# Patient Record
Sex: Male | Born: 1980 | Race: Black or African American | Hispanic: No | Marital: Single | Smoking: Current every day smoker
Health system: Southern US, Community
[De-identification: ages and names within clinical notes are randomized; demographics above are authoritative.]

---

## 2013-01-29 ENCOUNTER — Emergency Department (HOSPITAL_COMMUNITY)
Admission: EM | Admit: 2013-01-29 | Discharge: 2013-01-29 | Disposition: A | Discharge status: Home / Self Care | Payer: Self-pay | Attending: Emergency Medicine | Admitting: Emergency Medicine

## 2013-01-29 ENCOUNTER — Encounter (HOSPITAL_COMMUNITY): Payer: Self-pay | Admitting: Family Medicine

## 2013-01-29 DIAGNOSIS — R52 Pain, unspecified: Secondary | ICD-10-CM | POA: Insufficient documentation

## 2013-01-29 DIAGNOSIS — R110 Nausea: Secondary | ICD-10-CM | POA: Insufficient documentation

## 2013-01-29 DIAGNOSIS — N390 Urinary tract infection, site not specified: Secondary | ICD-10-CM | POA: Insufficient documentation

## 2013-01-29 DIAGNOSIS — R51 Headache: Secondary | ICD-10-CM | POA: Insufficient documentation

## 2013-01-29 DIAGNOSIS — F17200 Nicotine dependence, unspecified, uncomplicated: Secondary | ICD-10-CM | POA: Insufficient documentation

## 2013-01-29 DIAGNOSIS — J029 Acute pharyngitis, unspecified: Secondary | ICD-10-CM | POA: Insufficient documentation

## 2013-01-29 LAB — COMPREHENSIVE METABOLIC PANEL
ALT: 29 U/L (ref 0–53)
AST: 39 U/L — ABNORMAL HIGH (ref 0–37)
Albumin: 3.6 g/dL (ref 3.5–5.2)
Alkaline Phosphatase: 93 U/L (ref 39–117)
Potassium: 4.5 mEq/L (ref 3.5–5.1)
Sodium: 131 mEq/L — ABNORMAL LOW (ref 135–145)
Total Protein: 8.1 g/dL (ref 6.0–8.3)

## 2013-01-29 LAB — URINALYSIS, ROUTINE W REFLEX MICROSCOPIC
Glucose, UA: NEGATIVE mg/dL
Ketones, ur: 15 mg/dL — AB
Nitrite: POSITIVE — AB
Protein, ur: 30 mg/dL — AB
Specific Gravity, Urine: 1.03 (ref 1.005–1.030)
Urobilinogen, UA: 1 mg/dL (ref 0.0–1.0)
pH: 5.5 (ref 5.0–8.0)

## 2013-01-29 LAB — URINE MICROSCOPIC-ADD ON

## 2013-01-29 LAB — RAPID STREP SCREEN (MED CTR MEBANE ONLY): Streptococcus, Group A Screen (Direct): NEGATIVE

## 2013-01-29 LAB — CBC WITH DIFFERENTIAL/PLATELET
Basophils Absolute: 0 10*3/uL (ref 0.0–0.1)
Basophils Relative: 0 % (ref 0–1)
Eosinophils Absolute: 0 10*3/uL (ref 0.0–0.7)
Lymphs Abs: 1 10*3/uL (ref 0.7–4.0)
MCH: 29.1 pg (ref 26.0–34.0)
Neutrophils Relative %: 77 % (ref 43–77)
Platelets: 183 10*3/uL (ref 150–400)
RBC: 4.36 MIL/uL (ref 4.22–5.81)

## 2013-01-29 MED ORDER — MORPHINE SULFATE 4 MG/ML IJ SOLN
4.0000 mg | Freq: Once | INTRAMUSCULAR | Status: AC
  Administered 2013-01-29: 4 mg via INTRAVENOUS
  Filled 2013-01-29: qty 1

## 2013-01-29 MED ORDER — SODIUM CHLORIDE 0.9 % IV BOLUS (SEPSIS)
2000.0000 mL | Freq: Once | INTRAVENOUS | Status: AC
  Administered 2013-01-29: 2000 mL via INTRAVENOUS

## 2013-01-29 MED ORDER — CIPROFLOXACIN HCL 500 MG PO TABS: 500.0000 mg | ORAL_TABLET | Freq: Two times a day (BID) | ORAL | Status: DC

## 2013-01-29 MED ORDER — METOCLOPRAMIDE HCL 5 MG/ML IJ SOLN
10.0000 mg | Freq: Once | INTRAMUSCULAR | Status: AC
  Administered 2013-01-29: 10 mg via INTRAVENOUS
  Filled 2013-01-29: qty 2

## 2013-01-29 MED ORDER — METRONIDAZOLE 500 MG PO TABS
2000.0000 mg | ORAL_TABLET | Freq: Once | ORAL | Status: AC
  Administered 2013-01-29: 2000 mg via ORAL
  Filled 2013-01-29: qty 4

## 2013-01-29 MED ORDER — IBUPROFEN 800 MG PO TABS: 800.0000 mg | ORAL_TABLET | Freq: Three times a day (TID) | ORAL | Status: DC | PRN

## 2013-01-29 MED ORDER — KETOROLAC TROMETHAMINE 30 MG/ML IJ SOLN
30.0000 mg | Freq: Once | INTRAMUSCULAR | Status: AC
  Administered 2013-01-29: 30 mg via INTRAVENOUS
  Filled 2013-01-29: qty 1

## 2013-01-29 MED ORDER — CIPROFLOXACIN IN D5W 400 MG/200ML IV SOLN
400.0000 mg | Freq: Once | INTRAVENOUS | Status: AC
  Administered 2013-01-29: 400 mg via INTRAVENOUS
  Filled 2013-01-29 (×2): qty 200

## 2013-01-29 MED ORDER — HYDROCODONE-ACETAMINOPHEN 5-325 MG PO TABS: 1.0000 | ORAL_TABLET | Freq: Four times a day (QID) | ORAL | Status: DC | PRN

## 2013-01-29 MED ORDER — DEXTROSE 5 % IV SOLN: 1.0000 g | Freq: Once | INTRAVENOUS | Status: DC

## 2013-01-29 MED ORDER — ACETAMINOPHEN 325 MG PO TABS
650.0000 mg | ORAL_TABLET | Freq: Once | ORAL | Status: AC
  Administered 2013-01-29: 650 mg via ORAL
  Filled 2013-01-29: qty 2

## 2013-01-29 NOTE — ED Provider Notes (Signed)
Medical screening examination/treatment/procedure(s) were performed by non-physician practitioner and as supervising physician I was immediately available for consultation/collaboration.    Vida Roller, MD 01/29/13 [phone]

## 2013-01-29 NOTE — ED Notes (Signed)
Per pt fever, HA, Nausea, diarrhea for 4 days.

## 2013-01-29 NOTE — ED Provider Notes (Signed)
History     CSN: [id number]  Arrival date & time 01/29/13  1206   First MD Initiated Contact with Patient 01/29/13 1254      Chief Complaint  Patient presents with  . Fever  . Nausea  . Headache    (Consider location/radiation/quality/duration/timing/severity/associated sxs/prior treatment) HPI Patient presents to the emergency department with fever, and body aches for the last 24 hour.  Patient, states, that he also has headache.  Patient denies chest pain, shortness of breath, cough, nausea, vomiting, weakness, dizziness, abdominal pain, visual change, neck pain, difficulty swallowing, difficulty breathing, runny nose, ear pain, or syncope.  Patient, states, that has had some sore throat.  The patient states he did not take anything prior to arrival for his symptoms.  Patient, states nothing seems to make his condition, better or worse. History reviewed. No pertinent past medical history.  History reviewed. No pertinent past surgical history.  History reviewed. No pertinent family history.  History  Substance Use Topics  . Smoking status: Current Every Day Smoker  . Smokeless tobacco: Not on file  . Alcohol Use: Yes      Review of Systems All other systems negative except as documented in the HPI. All pertinent positives and negatives as reviewed in the HPI. Allergies  Review of patient's allergies indicates no known allergies.  Home Medications   Current Outpatient Rx  Name  Route  Sig  Dispense  Refill  . Acetaminophen (TYLENOL PO)   Oral   Take 2 tablets by mouth every 6 (six) hours as needed (fever).         . ASPIRIN PO   Oral   Take 2 tablets by mouth 2 (two) times daily as needed (pain.).         Marland Kitchen Chlorphen-Pseudoephed-APAP (THERAFLU FLU/COLD PO)   Oral   Take 1 capsule by mouth once as needed.           BP 122/64  Pulse 73  Temp(Src) 99.6 F (37.6 C) (Oral)  Resp 20  SpO2 96%  Physical Exam  Nursing note and vitals  reviewed. Constitutional: He is oriented to person, place, and time. He appears well-developed and well-nourished. No distress.  HENT:  Head: Normocephalic and atraumatic.  Mouth/Throat: Oropharynx is clear and moist. No oropharyngeal exudate.  Eyes: Pupils are equal, round, and reactive to light.  Neck: Normal range of motion. Neck supple. No tracheal deviation present.  Cardiovascular: Normal rate, regular rhythm and normal heart sounds.  Exam reveals no gallop and no friction rub.   No murmur heard. Pulmonary/Chest: Effort normal and breath sounds normal. No stridor. No respiratory distress. He has no wheezes. He has no rales.  Abdominal: Soft. Bowel sounds are normal. He exhibits no distension. There is no tenderness. There is no guarding.  Lymphadenopathy:    He has no cervical adenopathy.  Neurological: He is alert and oriented to person, place, and time. He exhibits normal muscle tone. Coordination normal.  Skin: Skin is warm and dry. No rash noted.    ED Course  Procedures (including critical care time)  Labs Reviewed  CBC WITH DIFFERENTIAL - Abnormal; Notable for the following:    Hemoglobin 12.7 (*)    HCT 38.0 (*)    All other components within normal limits  COMPREHENSIVE METABOLIC PANEL - Abnormal; Notable for the following:    Sodium 131 (*)    Chloride 95 (*)    Glucose, Bld 126 (*)    AST 39 (*)  GFR calc non Af Amer 78 (*)    All other components within normal limits  URINALYSIS, ROUTINE W REFLEX MICROSCOPIC - Abnormal; Notable for the following:    Color, Urine AMBER (*)    APPearance CLOUDY (*)    Hgb urine dipstick MODERATE (*)    Bilirubin Urine SMALL (*)    Ketones, ur 15 (*)    Protein, ur 30 (*)    Nitrite POSITIVE (*)    Leukocytes, UA LARGE (*)    All other components within normal limits  URINE MICROSCOPIC-ADD ON - Abnormal; Notable for the following:    Squamous Epithelial / LPF FEW (*)    Bacteria, UA MANY (*)    All other components within  normal limits  RAPID STREP SCREEN  URINE CULTURE  CBC WITH DIFFERENTIAL  BASIC METABOLIC PANEL   patient has findings on his urinalysis, that are concerning for UTI.  Patient does not have any abdominal pain, flank pain, or back pain.  He does, however, have body aches, most likely due to the fever.  Patient is given 2 L of fluid is feeling some better, but still has mild headache.  Patient will be advised to return here for any worsening in his condition.  Patient has had no vomiting, and his vital signs have remained stable here in the emergency department.  MDM  MDM Reviewed: vitals and nursing note Interpretation: labs            Carlyle Dolly, PA-C 01/29/13 1626

## 2013-01-31 LAB — URINE CULTURE

## 2013-01-31 LAB — GC/CHLAMYDIA PROBE AMP
CT Probe RNA: NEGATIVE
GC Probe RNA: NEGATIVE

## 2013-02-01 ENCOUNTER — Telehealth (HOSPITAL_COMMUNITY): Payer: Self-pay | Admitting: Emergency Medicine

## 2013-02-01 NOTE — ED Notes (Signed)
+  Urine. Patient treated with Cipro. Sensitive to same. Per protocol MD. °

## 2013-02-01 NOTE — ED Notes (Signed)
Patient has +Urine culture. °

## 2013-12-15 ENCOUNTER — Encounter (HOSPITAL_COMMUNITY): Payer: Self-pay | Admitting: Emergency Medicine

## 2013-12-15 ENCOUNTER — Emergency Department (HOSPITAL_COMMUNITY)
Admission: EM | Admit: 2013-12-15 | Discharge: 2013-12-15 | Discharge status: Against Medical Advice | Payer: Self-pay | Attending: Emergency Medicine | Admitting: Emergency Medicine

## 2013-12-15 DIAGNOSIS — R51 Headache: Secondary | ICD-10-CM | POA: Insufficient documentation

## 2013-12-15 DIAGNOSIS — M542 Cervicalgia: Secondary | ICD-10-CM | POA: Insufficient documentation

## 2013-12-15 DIAGNOSIS — F17200 Nicotine dependence, unspecified, uncomplicated: Secondary | ICD-10-CM | POA: Insufficient documentation

## 2013-12-15 NOTE — ED Notes (Signed)
Called pt name three times to reassess vital signs, no answer.

## 2013-12-15 NOTE — ED Notes (Signed)
Called x's 3 without answer 

## 2013-12-15 NOTE — ED Notes (Signed)
Called patient for reassessment. No answer.  

## 2013-12-15 NOTE — ED Notes (Signed)
Pt reports headache for 5 days that goes to different sides, but can feel ache behind his right eye.  No change in vision.  Pt reports some neck pain that was intermittent for a while before ever having the headache.  No weakness in arms or legs

## 2014-01-02 ENCOUNTER — Emergency Department (HOSPITAL_COMMUNITY): Payer: Self-pay

## 2014-01-02 ENCOUNTER — Encounter (HOSPITAL_COMMUNITY): Payer: Self-pay | Admitting: Emergency Medicine

## 2014-01-02 ENCOUNTER — Emergency Department (HOSPITAL_COMMUNITY)
Admission: EM | Admit: 2014-01-02 | Discharge: 2014-01-02 | Disposition: A | Discharge status: Home / Self Care | Payer: Self-pay | Attending: Emergency Medicine | Admitting: Emergency Medicine

## 2014-01-02 DIAGNOSIS — F17200 Nicotine dependence, unspecified, uncomplicated: Secondary | ICD-10-CM | POA: Insufficient documentation

## 2014-01-02 DIAGNOSIS — N12 Tubulo-interstitial nephritis, not specified as acute or chronic: Secondary | ICD-10-CM | POA: Insufficient documentation

## 2014-01-02 LAB — URINALYSIS, ROUTINE W REFLEX MICROSCOPIC
Bilirubin Urine: NEGATIVE
GLUCOSE, UA: NEGATIVE mg/dL
KETONES UR: NEGATIVE mg/dL
Nitrite: NEGATIVE
PH: 5.5 (ref 5.0–8.0)
Protein, ur: NEGATIVE mg/dL
Specific Gravity, Urine: 1.024 (ref 1.005–1.030)
Urobilinogen, UA: 0.2 mg/dL (ref 0.0–1.0)

## 2014-01-02 LAB — URINE MICROSCOPIC-ADD ON

## 2014-01-02 LAB — BASIC METABOLIC PANEL
BUN: 18 mg/dL (ref 6–23)
CHLORIDE: 99 meq/L (ref 96–112)
CO2: 25 mEq/L (ref 19–32)
Calcium: 9.7 mg/dL (ref 8.4–10.5)
Creatinine, Ser: 1.03 mg/dL (ref 0.50–1.35)
GFR calc Af Amer: >90 mL/min (ref >90)
GFR calc non Af Amer: >90 mL/min (ref >90)
GLUCOSE: 86 mg/dL (ref 70–99)
POTASSIUM: 4.9 meq/L (ref 3.7–5.3)
Sodium: 138 mEq/L (ref 137–147)

## 2014-01-02 LAB — CBC
HEMATOCRIT: 38.8 % — AB (ref 39.0–52.0)
HEMOGLOBIN: 13 g/dL (ref 13.0–17.0)
MCH: 30.4 pg (ref 26.0–34.0)
MCHC: 33.5 g/dL (ref 30.0–36.0)
MCV: 90.7 fL (ref 78.0–100.0)
Platelets: 215 10*3/uL (ref 150–400)
RBC: 4.28 MIL/uL (ref 4.22–5.81)
RDW: 12.4 % (ref 11.5–15.5)
WBC: 12.4 10*3/uL — AB (ref 4.0–10.5)

## 2014-01-02 MED ORDER — CIPROFLOXACIN HCL 500 MG PO TABS: 500.0000 mg | ORAL_TABLET | Freq: Two times a day (BID) | ORAL | Status: DC

## 2014-01-02 MED ORDER — HYDROCODONE-ACETAMINOPHEN 5-325 MG PO TABS: 1.0000 | ORAL_TABLET | ORAL | Status: DC | PRN

## 2014-01-02 MED ORDER — SODIUM CHLORIDE 0.9 % IV BOLUS (SEPSIS)
1000.0000 mL | Freq: Once | INTRAVENOUS | Status: AC
  Administered 2014-01-02: 1000 mL via INTRAVENOUS

## 2014-01-02 NOTE — ED Notes (Signed)
He does not want to be stuck for labs because he thinks he has a UTI and does not like needles, discussed importance of labs for abd pain with pt but he states he would rather wait

## 2014-01-02 NOTE — ED Provider Notes (Signed)
CSN: [id number]     Arrival date & time 01/02/14  1131 History   First MD Initiated Contact with Patient 01/02/14 1210     Chief Complaint  Patient presents with  . Hematuria     (Consider location/radiation/quality/duration/timing/severity/associated sxs/prior Treatment) HPI Comments: Franklin Love is a 33 y.o. Male, presenting the Emergency Department with a chief complaint of Right abdominal pain and hematuria.  The patient reports intermittent sharp pain to RLQ with radiation to groin.  The patient describes the RLQ discomfort as, sharp, radiation to the groin, intermittent, abrupt onset and crippling. Denies pain at this time. The patient reports hematuria since today.  He reports dysuria.  Denies penile discharge, testicular pain, testicular swelling, genital lesions.  He denies nausea or vomiting. He denies a history of kidney stones. Hereports very little water intake and reports heavy EtOH use. No PCP   The history is provided by the patient and medical records. No language interpreter was used.    History reviewed. No pertinent past medical history. History reviewed. No pertinent past surgical history. History reviewed. No pertinent family history. History  Substance Use Topics  . Smoking status: Current Every Day Smoker  . Smokeless tobacco: Not on file  . Alcohol Use: Yes    Review of Systems  Constitutional: Negative for fever and chills.  Gastrointestinal: Positive for abdominal pain. Negative for nausea, vomiting, diarrhea, blood in stool, abdominal distention and anal bleeding.  Genitourinary: Positive for dysuria and hematuria. Negative for urgency, discharge, scrotal swelling, genital sores, penile pain and testicular pain.  Musculoskeletal: Negative for back pain.  Skin: Negative for pallor and wound.      Allergies  Review of patient's allergies indicates no known allergies.  Home Medications   No current outpatient prescriptions on file. BP 113/71   Pulse 64  Temp(Src) 99.1 F (37.3 C) (Oral)  Resp 20  Ht 5\' 7"  (1.702 m)  Wt 197 lb 6.4 oz (89.54 kg)  BMI 30.91 kg/m2  SpO2 98% Physical Exam  Nursing note and vitals reviewed. Constitutional: He is oriented to person, place, and time. He appears well-developed and well-nourished. No distress.  HENT:  Head: Normocephalic and atraumatic.  Eyes: EOM are normal. Pupils are equal, round, and reactive to light. No scleral icterus.  Neck: Neck supple.  Cardiovascular: Normal rate, regular rhythm and normal heart sounds.   No murmur heard. Pulmonary/Chest: Effort normal and breath sounds normal. No respiratory distress. He has no wheezes. He has no rales.  Abdominal: Soft. Normal appearance and bowel sounds are normal. There is no tenderness. There is no rigidity, no rebound, no guarding, no CVA tenderness and no tenderness at McBurney's point.  Unable to reproduce discomfort with palpation.  Musculoskeletal: Normal range of motion. He exhibits no edema.  Neurological: He is alert and oriented to person, place, and time.  Skin: Skin is warm and dry. No rash noted.  Psychiatric: He has a normal mood and affect. His behavior is normal.    ED Course  Procedures (including critical care time) Labs Review Labs Reviewed  URINALYSIS, ROUTINE W REFLEX MICROSCOPIC - Abnormal; Notable for the following:    APPearance CLOUDY (*)    Hgb urine dipstick LARGE (*)    Leukocytes, UA LARGE (*)    All other components within normal limits  CBC - Abnormal; Notable for the following:    WBC 12.4 (*)    HCT 38.8 (*)    All other components within normal limits  URINE MICROSCOPIC-ADD ON -  Abnormal; Notable for the following:    Squamous Epithelial / LPF FEW (*)    Bacteria, UA MANY (*)    All other components within normal limits  BASIC METABOLIC PANEL   Imaging Review Ct Abdomen Pelvis Wo Contrast  01/02/2014   CLINICAL DATA:  Hematuria, right flank pain  EXAM: CT ABDOMEN AND PELVIS WITHOUT  CONTRAST  TECHNIQUE: Multidetector CT imaging of the abdomen and pelvis was performed following the standard protocol without intravenous contrast.  COMPARISON:  None.  FINDINGS: There is no nephrolithiasis or hydroureteronephrosis bilaterally. The kidneys are normal. The liver, spleen, pancreas, gallbladder, adrenal glands are normal. The aorta is normal. There is no abdominal lymphadenopathy. There is no small bowel obstruction or diverticulitis. The appendix is normal.  The bladder is decompressed limiting evaluation. The lung bases are clear. The visualized bones are normal.  IMPRESSION: No nephrolithiasis or hydroureteronephrosis bilaterally. The appendix is normal. No acute abnormality identified in the abdomen and pelvis.   Electronically Signed   By: Sherian Rein M.D.   On: 01/02/2014 14:44     EKG Interpretation None      MDM   Final diagnoses:  Pyelonephritis   Pt with intermittent abdominal pain for 3 days, dysuria, and hematuria.  No history of stone.  EMR shows treated for UTI in the past.  Denies pain in the ED. UA shows RBC, Many bacteria.  Large Leukocytes. CT without evidence of stone. Will treat for UTI and have the patient follow up with urology for further evaluation of 2 urinary infections in a male pt. Re-eval Pt denies abdominal discomfort while in the ED. Discussed lab results, imaging results, and treatment plan with the patient. Advises drinking more fluids and avoiding EtOH.  Resources given. Return precautions given. Reports understanding and no other concerns at this time.  Patient is stable for discharge at this time.  Meds given in ED:  Medications  sodium chloride 0.9 % bolus 1,000 mL (0 mLs Intravenous Stopped 01/02/14 1519)    Discharge Medication List as of 01/02/2014  3:31 PM    START taking these medications   Details  ciprofloxacin (CIPRO) 500 MG tablet Take 1 tablet (500 mg total) by mouth 2 (two) times daily., Starting 01/02/2014, Until  Discontinued, Print    HYDROcodone-acetaminophen (NORCO/VICODIN) 5-325 MG per tablet Take 1 tablet by mouth every 4 (four) hours as needed., Starting 01/02/2014, Until Discontinued, Print          Clabe Seal, PA-C 01/03/14 1431

## 2014-01-02 NOTE — ED Notes (Signed)
Pt states hes had R sided abd pain for past 3 days then today he noticed blood in his urine

## 2014-01-02 NOTE — Discharge Instructions (Signed)
Call Dr Isabel Caprice [phone] for further evaluation of your hematuria and urinary tract infection. Call for a follow up appointment with a Family or Primary Care Provider.  Return if Symptoms worsen.   Stop drinking alcohol. If you need help with your alcohol use, see resource list below.  Or you can return to the ED for help. And drink plenty of water to help with your infection.  Take medication as prescribed.    Emergency Department Resource Guide 1) Find a Doctor and Pay Out of Pocket Although you won't have to find out who is covered by your insurance plan, it is a good idea to ask around and get recommendations. You will then need to call the office and see if the doctor you have chosen will accept you as a new patient and what types of options they offer for patients who are self-pay. Some doctors offer discounts or will set up payment plans for their patients who do not have insurance, but you will need to ask so you aren't surprised when you get to your appointment.  2) Contact Your Local Health Department Not all health departments have doctors that can see patients for sick visits, but many do, so it is worth a call to see if yours does. If you don't know where your local health department is, you can check in your phone book. The CDC also has a tool to help you locate your state's health department, and many state websites also have listings of all of their local health departments.  3) Find a Walk-in Clinic If your illness is not likely to be very severe or complicated, you may want to try a walk in clinic. These are popping up all over the country in pharmacies, drugstores, and shopping centers. They're usually staffed by nurse practitioners or physician assistants that have been trained to treat common illnesses and complaints. They're usually fairly quick and inexpensive. However, if you have serious medical issues or chronic medical problems, these are probably not your best  option.  No Primary Care Doctor: - Call Health Connect at  [phone] - they can help you locate a primary care doctor that  accepts your insurance, provides certain services, etc. - Physician Referral Service- [phone]  Chronic Pain Problems: Organization         Address  Phone   Notes  [facility]  [phone] Patients need to be referred by their primary care doctor.   Medication Assistance: Organization         Address  Phone   Notes  [facility] Medication [facility] [address] [place] [place], [place] [address] [phone] --Must be a resident of [facility] -- Must have NO insurance coverage whatsoever (no Medicaid/ Medicare, etc.) -- The pt. MUST have a primary care doctor that directs their care regularly and follows them in the community   [facility]  [phone]   [employer]  [phone]    Agencies that provide inexpensive medical care: Organization         Address  Phone   Notes  [facility]  [phone]   [facility]    [phone]   [facility] [address] [place], [place] [address] [phone]   [facility] [address] [place], [place] [phone]   Planned Parenthood    [phone]   [facility]    [phone]  [facility]  [address], [place] Phone:  [phone], Fax:  [phone] Hours of Operation:  9 am - 6 pm, M-F.  Also accepts Medicaid/Medicare and self-pay.  [facility] for [place] [address], [address], [place] Phone: [phone], Fax: [phone]. Hours of Operation:  8:30 am - 5:30 pm, M-F.  Also accepts Medicaid and self-pay.  [facility] [place] [address] [place] Phone: [phone]   [place], [place], [place] [phone], Ext. [phone] Mondays & Thursdays: 7-9 AM.  First 15  patients are seen on a first come, first serve basis.    [place] Providers:  Organization         Address  Phone   Notes  [facility] [address],  [phone] Also accepts self-pay patients.  [facility] [address], [place]  [phone]   [place], [address], [place] [phone]   [facility] [address], [place] [phone]   Lucianne Lei [address], [place]   [phone] Only accepts [place] Access [place] patients after they have their name applied to their card.   Self-Pay (no insurance) in [facility]:  Organization         Address  Phone   Notes  Sickle Cell Patients, [facility] Internal Medicine [place] [phone]   [facility] [place] [phone]   [facility] [place]  [place], [place], [place] [phone]   [facility]/Dr. Osei-Bonsu  [address] [place] or [address], [place] [phone] Phone number for both [place] and [place] locations is the same.  [facility]gent Medical and [facility] [address], [place] [phone]   [facility] [address], [place] or [address] [phone]   [facility] [address], [place] [phone], phone; [phone], fax Sees patients 1st and 3rd Saturday of every month.  Must not qualify for public or private insurance (i.e. Medicaid, Medicare, [place] Health Choice, Veterans' Benefits)  Household income should be no more than 200% of the poverty level The clinic cannot treat you if you are pregnant or think you are pregnant  Sexually transmitted diseases are not treated at the clinic.    Dental  Care: Organization         Address  Phone  Notes  [facility] Regino Ramirez [phone] Accepts children up to age 62 who are enrolled in [place] or [place]; pregnant women with a Medicaid card; and children who have applied for Medicaid or [place] Health Choice, but were declined, whose parents can pay a reduced fee at time of service.  [facility] Department of [facility]  [address], [place] [phone] Accepts children up to age 71 who are enrolled in [place] or [place]; pregnant women with a Medicaid card; and children who have applied for Medicaid or [place] Health Choice, but were declined, whose parents can pay a reduced fee at time of service.  [place]  [phone]  [place] [phone] Patients are seen by appointment only. Walk-ins are not accepted. [place] will see patients 26 years of age and older. Monday - Tuesday (8am-5pm) Most Wednesdays (8:30-5pm) $30 per visit, cash only  [facility] Adult Dental Access PROGRAM  [address], [facility] [phone] Patients are seen by appointment only. Walk-ins are not accepted. [place] will see patients 62 years of age and older. One Wednesday Evening (Monthly: Volunteer Based).  $30 per visit, cash only  [place]  [phone] for adults; Children under age 39, call Graduate Pediatric Dentistry at [phone]. Children aged 75-14, please call [phone] to request a pediatric application.  Dental services are provided in all areas of dental care including fillings, crowns and bridges, complete and partial dentures, implants, gum treatment, root canals, and extractions. Preventive care is also provided. Treatment is provided to both adults and children. Patients are selected via a lottery and there is often a waiting list.   [facility] [address] [place]  [phone] [email or web address]   [facility] [address] [place], [place] [phone], Ext. [phone] Second and Fourth Thursday of each month, opens at 6:30 AM; Clinic ends at 9 AM.  Patients are seen on a first-come first-served basis, and a limited number are seen during each clinic.   [facility]  [address] Hillard Danker [place], [place] [phone]   Eligibility Requirements You must have lived in [place], [place], or [place] counties for at least the last three months.   You cannot be eligible for state or federal sponsored [employer], including [employer], [place], or [employer].   You generally cannot be eligible for healthcare insurance through your employer.    How to apply: Eligibility screenings are held every Tuesday and Wednesday afternoon from 1:00 pm until 4:00 pm. You do not need an appointment for the interview!  [facility] [address] [place], [place]   [place]  [place] Department  [place]  [phone]    Behavioral Health Resources in the Community: Intensive Outpatient Programs Organization         Address  Phone  Notes  [place]. [address] [place], [place] [phone]   [facility] Outpatient [address] [place], [place]   [facility]: [facility] [address], [place], [place]   [place] [address]  [place], [place] or [phone]   Substance Abuse Resources Organization         Address  Phone  Notes  Alcohol and Drug Services  [phone]   [place]  [phone]   The [place]   Chinita Pester  [phone]   Residential & Outpatient Substance Abuse Program  [phone]    Psychological Services Organization         Address  Phone  Notes  [facility] [place]  [place]  [phone]   [place] [address] [place] or [phone]    Mobile Crisis Teams Organization         Address  Phone  Notes  [facility], Mobile Crisis Care Unit  [phone]   [facility]  [address] [place], [place]  [facility] De[facility]sch [address] [place] [address] [place] [phone]    Self-Help/Support Groups Organization         Address  Phone             Notes  [facility] [place] - variety of support groups  336- [phone] Call for more information  Narcotics Anonymous (NA), [facility] [address], [place] [employer]  2 meetings at this location   Statisticianesidential Treatment Programs Organization         Address  Phone  Notes  [facility] [address],    [place]  [phone]   [facility]  [address], [place] [phone], [place], [place] [phone]   [facility] [address], [place] [phone] Admissions: 8am-3pm M-F  [facility] [address]    [place], [place] [phone]   [facility] [address], [place], [place] [phone]   [facility] [address]  [place], [place] [phone]   [facility] - Intensive Outpatient [address], [place], [place] [phone]   [facility] ([facility] [address]  [place], [place] [phone] or [phone]   [facility] ([facility]) [address] [place], [place] [phone] Accepts Medicaid  [facility] [address]  [place] [phone] Substance Abuse/Addiction Treatment   [facility] Behavioral Health Resources Organization         Address  Phone  Notes  [facility]  [phone]   Angie FavaJulie Brannon, PhD [address], [address] [place], [place]   [phone] or [phone]   [facility]   [address] [place], [place] [phone](336) [phone]   [facility] [address], [place], [place] [phone] Insurance/Medicaid/sponsorship through [facility]  [facility] Families [address]                                    [place], [place] [phone] Therapy/tele-psych/case  [facility] [address].   [place], [place] [phone]    Dr. Lolly MustacheArfeen  [phone]   [facility] [place]  [employer] [facility] 1) [address], [place] 2) [address], [place] 3)  [address], [place] [phone]  [phone]   [facility] Child Abuse Hotline [phone]([phone]) [phone] or [phone] (After Hours)

## 2014-01-05 NOTE — ED Provider Notes (Signed)
Medical screening examination/treatment/procedure(s) were performed by non-physician practitioner and as supervising physician I was immediately available for consultation/collaboration.   Toy BakerAnthony T Areon Cocuzza, MD 01/05/14 [phone]

## 2014-07-07 ENCOUNTER — Emergency Department (HOSPITAL_COMMUNITY)
Admission: EM | Admit: 2014-07-07 | Discharge: 2014-07-07 | Disposition: A | Discharge status: Home / Self Care | Payer: Self-pay

## 2014-07-07 NOTE — ED Notes (Signed)
Pt called x1, no answer 

## 2015-11-07 ENCOUNTER — Emergency Department (HOSPITAL_COMMUNITY): Payer: Self-pay

## 2015-11-07 ENCOUNTER — Emergency Department (HOSPITAL_COMMUNITY)
Admission: EM | Admit: 2015-11-07 | Discharge: 2015-11-07 | Disposition: A | Discharge status: Home / Self Care | Payer: Self-pay | Attending: Emergency Medicine | Admitting: Emergency Medicine

## 2015-11-07 ENCOUNTER — Encounter (HOSPITAL_COMMUNITY): Payer: Self-pay | Admitting: Emergency Medicine

## 2015-11-07 DIAGNOSIS — B353 Tinea pedis: Secondary | ICD-10-CM | POA: Insufficient documentation

## 2015-11-07 DIAGNOSIS — F17200 Nicotine dependence, unspecified, uncomplicated: Secondary | ICD-10-CM | POA: Insufficient documentation

## 2015-11-07 DIAGNOSIS — Z792 Long term (current) use of antibiotics: Secondary | ICD-10-CM | POA: Insufficient documentation

## 2015-11-07 DIAGNOSIS — M79672 Pain in left foot: Secondary | ICD-10-CM

## 2015-11-07 MED ORDER — CEPHALEXIN 500 MG PO CAPS: 500.0000 mg | ORAL_CAPSULE | Freq: Four times a day (QID) | ORAL | Status: DC

## 2015-11-07 MED ORDER — CLOTRIMAZOLE 1 % EX CREA: TOPICAL_CREAM | CUTANEOUS | Status: DC

## 2015-11-07 MED ORDER — IBUPROFEN 600 MG PO TABS: 600.0000 mg | ORAL_TABLET | Freq: Three times a day (TID) | ORAL | Status: DC | PRN

## 2015-11-07 NOTE — Discharge Instructions (Signed)
Read the information below.  Use the prescribed medication as directed.  Please discuss all new medications with your pharmacist.  You may return to the Emergency Department at any time for worsening condition or any new symptoms that concern you.  If you develop redness, swelling, pus draining from the wound, or fevers greater than 100.4, return to the ER immediately for a recheck.     Athlete's Foot Athlete's foot (tinea pedis) is a fungal infection of the skin on the feet. It often occurs on the skin between the toes or underneath the toes. It can also occur on the soles of the feet. Athlete's foot is more likely to occur in hot, humid weather. Not washing your feet or changing your socks often enough can contribute to athlete's foot. The infection can spread from person to person (contagious). CAUSES Athlete's foot is caused by a fungus. This fungus thrives in warm, moist places. Most people get athlete's foot by sharing shower stalls, towels, and wet floors with an infected person. People with weakened immune systems, including those with diabetes, may be more likely to get athlete's foot. SYMPTOMS   Itchy areas between the toes or on the soles of the feet.  White, flaky, or scaly areas between the toes or on the soles of the feet.  Tiny, intensely itchy blisters between the toes or on the soles of the feet.  Tiny cuts on the skin. These cuts can develop a bacterial infection.  Thick or discolored toenails. DIAGNOSIS  Your caregiver can usually tell what the problem is by doing a physical exam. Your caregiver may also take a skin sample from the rash area. The skin sample may be examined under a microscope, or it may be tested to see if fungus will grow in the sample. A sample may also be taken from your toenail for testing. TREATMENT  Over-the-counter and prescription medicines can be used to kill the fungus. These medicines are available as powders or creams. Your caregiver can suggest  medicines for you. Fungal infections respond slowly to treatment. You may need to continue using your medicine for several weeks. PREVENTION   Do not share towels.  Wear sandals in wet areas, such as shared locker rooms and shared showers.  Keep your feet dry. Wear shoes that allow air to circulate. Wear cotton or wool socks. HOME CARE INSTRUCTIONS   Take medicines as directed by your caregiver. Do not use steroid creams on athlete's foot.  Keep your feet clean and cool. Wash your feet daily and dry them thoroughly, especially between your toes.  Change your socks every day. Wear cotton or wool socks. In hot climates, you may need to change your socks 2 to 3 times per day.  Wear sandals or canvas tennis shoes with good air circulation.  If you have blisters, soak your feet in Burow's solution or Epsom salts for 20 to 30 minutes, 2 times a day to dry out the blisters. Make sure you dry your feet thoroughly afterward. SEEK MEDICAL CARE IF:   You have a fever.  You have swelling, soreness, warmth, or redness in your foot.  You are not getting better after 7 days of treatment.  You are not completely cured after 30 days.  You have any problems caused by your medicines. MAKE SURE YOU:   Understand these instructions.  Will watch your condition.  Will get help right away if you are not doing well or get worse.   This information is not intended  to replace advice given to you by your health care provider. Make sure you discuss any questions you have with your health care provider.   Document Released: 10/06/2000 Document Revised: 01/01/2012 Document Reviewed: 04/12/2015 Elsevier Interactive Patient Education 2016 Elsevier Inc.  Pain Without a Known Cause WHAT IS PAIN WITHOUT A KNOWN CAUSE? Pain can occur in any part of the body and can range from mild to severe. Sometimes no cause can be found for why you are having pain. Some types of pain that can occur without a known cause  include:   Headache.  Back pain.  Abdominal pain.  Neck pain. HOW IS PAIN WITHOUT A KNOWN CAUSE DIAGNOSED?  Your health care provider will try to find the cause of your pain. This may include:  Physical exam.  Medical history.  Blood tests.  Urine tests.  X-rays. If no cause is found, your health care provider may diagnose you with pain without a known cause.  IS THERE TREATMENT FOR PAIN WITHOUT A CAUSE?  Treatment depends on the kind of pain you have. Your health care provider may prescribe medicines to help relieve your pain.  WHAT CAN I DO AT HOME FOR MY PAIN?   Take medicines only as directed by your health care provider.  Stop any activities that cause pain. During periods of severe pain, bed rest may help.  Try to reduce your stress with activities such as yoga or meditation. Talk to your health care provider for other stress-reducing activity recommendations.  Exercise regularly, if approved by your health care provider.  Eat a healthy diet that includes fruits and vegetables. This may improve pain. Talk to your health care provider if you have any questions about your diet. WHAT IF MY PAIN DOES NOT GET BETTER?  If you have a painful condition and no reason can be found for the pain or the pain gets worse, it is important to follow up with your health care provider. It may be necessary to repeat tests and look further for a possible cause.    This information is not intended to replace advice given to you by your health care provider. Make sure you discuss any questions you have with your health care provider.   Document Released: 07/04/2001 Document Revised: 10/30/2014 Document Reviewed: 02/24/2014 Elsevier Interactive Patient Education 2016 [employer].    Emergency Department Resource Guide 1) Find a Doctor and Pay Out of Pocket Although you won't have to find out who is covered by your insurance plan, it is a good idea to ask around and get recommendations.  You will then need to call the office and see if the doctor you have chosen will accept you as a new patient and what types of options they offer for patients who are self-pay. Some doctors offer discounts or will set up payment plans for their patients who do not have insurance, but you will need to ask so you aren't surprised when you get to your appointment.  2) Contact Your Local Health Department Not all health departments have doctors that can see patients for sick visits, but many do, so it is worth a call to see if yours does. If you don't know where your local health department is, you can check in your phone book. The CDC also has a tool to help you locate your state's health department, and many state websites also have listings of all of their local health departments.  3) Find a Walk-in Clinic If your illness is  not likely to be very severe or complicated, you may want to try a walk in clinic. These are popping up all over the country in pharmacies, drugstores, and shopping centers. They're usually staffed by nurse practitioners or physician assistants that have been trained to treat common illnesses and complaints. They're usually fairly quick and inexpensive. However, if you have serious medical issues or chronic medical problems, these are probably not your best option.  No Primary Care Doctor: - Call Health Connect at  [phone] - they can help you locate a primary care doctor that  accepts your insurance, provides certain services, etc. - Physician Referral Service- [phone]  Chronic Pain Problems: Organization         Address  Phone   Notes  [facility]  [phone]) [phone] Patients need to be referred by their primary care doctor.   Medication Assistance: Organization         Address  Phone   Notes  [facility] Medication [facility] [address] [place], [place] [address] [phone]) [phone] --Must be a resident of [facility] [place] --  Must have NO insurance coverage whatsoever (no Medicaid/ Medicare, etc.) -- The pt. MUST have a primary care doctor that directs their care regularly and follows them in the community   [facility]ist  [phone]) [phone]   [employer]  [phone]    Agencies that provide inexpensive medical care: Organization         Address  Phone   Notes  [facility]  [phone]   [facility]    [phone]) [phone]   [facility] [address] [place], [place] [address] [phone]) [phone]   [facility] [address], [place] [phone]) [phone]   Planned Parenthood    [phone]) [phone]   [facility]    [phone]) [phone]   [facility]  [address], [place] Phone:  [phone](336) [phone], Fax:  [phone]) [phone] Hours of Operation:  9 am - 6 pm, M-F.  Also accepts Medicaid/Medicare and self-pay.  [facility]  [address], [place] Phone: [phone]) [phone], Fax: [phone]) [phone]. Hours of Operation:  8:30 am - 5:30 pm, M-F.  Also accepts Medicaid and self-pay.  [facility] [place] [address], [place] Phone: [phone]) [phone]   [facility] [address], [place], [place] [phone], Ext. [phone] Mondays & Thursdays: 7-9 AM.  First 15 patients are seen on a first come, first serve basis.    Medicaid-accepting [facility] Providers:  Organization         Address  Phone   Notes  [facility] [address], [place] [phone] Also accepts self-pay patients.  [facility] [address], [place]  [phone]) [phone]   [facility] [address], [place] [phone]) [phone]   [facility] [address] [place] [address], [place] [phone]   Renaye RakersVeita Bland [address], [place]   [phone]) [phone] Only accepts [place] Access [place] patients  after they have their name applied to their card.   Self-Pay (no insurance) in [facility]:  Organization         Address  Phone   Notes  Sickle Cell Patients, [facility] [address], [place] [phone]   [facility] [address] [place], [place] [phone]   [facility]  [address], [place] [phone]   [facility]/Dr. Osei-Bonsu  [address], [place] or [address], [place] [phone] Phone number for both [place] and [place] locations is the same.  [facility] [address] [place] [phone]   [facility] [address], [place] or [address] [phone]   [facility] [address] [place] [phone], phone; [phone], fax Sees patients 1st and 3rd Saturday of every month.  Must not qualify for public or private insurance (i.e. Medicaid, Medicare, [place] Health Choice, Veterans' Benefits)  Household income should be no more than 200% of the poverty level The clinic cannot treat you if you are pregnant or think you are pregnant  Sexually transmitted diseases are not treated at the clinic.    Dental Care: Organization         Address  Phone  Notes  [facility] [address] [place], [place] [phone] Accepts children up to age 36 who are enrolled in [place] or [place] Health Choice; pregnant women with a Medicaid card; and children who have applied for Medicaid or [place] Health Choice, but were declined, whose parents can pay a reduced fee at time of service.  [facility]  [address], [place] [phone] Accepts children up to age 43 who are enrolled in [place] or [place] Health Choice; pregnant women with a Medicaid card; and children  who have applied for Medicaid or [place]ah [place] Health Choice, but were declined, whose parents can pay a reduced fee at time of service.  [facility]  [address] [place], [place] [phone] Patients are seen by appointment only. Walk-ins are not accepted. [facility] will see patients 57 years of age and older. Monday - Tuesday (8am-5pm) Most Wednesdays (8:30-5pm) $30 per visit, cash only  [facility] Adult Dental Access PROGRAM  [address], [facility] [phone] Patients are seen by appointment only. Walk-ins are not accepted. [facility] will see patients 49 years of age and older. One Wednesday Evening (Monthly: Volunteer Based).  $30 per visit, cash only  [employer]  [phone] for adults; Children under age 80, call Graduate Pediatric Dentistry at [phone]. Children aged 26-14, please call [phone] to request a pediatric application.  Dental services are provided in all areas of dental care including fillings, crowns and bridges, complete and partial dentures, implants, gum treatment, root canals, and extractions. Preventive care is also provided. Treatment is provided to both adults and children. Patients are selected via a lottery and there is often a waiting list.   [facility] [address], [place]  [phone] [email or web address]   [facility] [address] [place], [place] [phone], Ext. [phone] Second and Fourth Thursday of each month, opens at 6:30 AM; Clinic ends at 9 AM.  Patients are seen on a first-come first-served basis, and a limited number are seen during each clinic.   [facility]  [address] Ether Griffins [place], [place] [phone]   Eligibility Requirements You must have lived in [place], [place], or [place] counties for at least the last three  months.   You cannot be eligible for state or federal sponsored [place], including [employer], [place], or [employer].   You generally cannot be eligible for healthcare insurance through your employer.    How to apply: Eligibility screenings are held every Tuesday and Wednesday afternoon from 1:00 pm until 4:00 pm. You do not need an appointment for the interview!  [facility] [address] [place], [place] [phone]   [facility] Health Department  [phone]   [facility] Health Department  [phone]   [facility] Health Department  [phone]    Behavioral Health Resources in the Community: Intensive Outpatient Programs Organization         Address  Phone  Notes  [facility] [address] [place], [place] [phone]   [facility] Outpatient [address], [place], [place] [phone]   [facility] [address], [place], [place]  [phone]   [facility] [address],  [place], [place] [phone] or [phone]   Substance Abuse Resources Organization         Address  Phone  Notes  [facility] Drug Services  [phone]   [facility]  [phone]   [facility]  [phone]   Floydene Flock  [phone]   Residential & Outpatient Substance Abuse Program  [phone]   Psychological Services Organization         Address  Phone  Notes  [facility]  [phone]   [facility]  [phone]   [facility] Mental Health [address] [place] [phone] or [phone]    Mobile Crisis Teams Organization         Address  Phone  Notes  [facility], Mobile Crisis Care Unit  [phone]   [facility]  [address] [place], [place] [phone]   Doristine Locks [address] [place] [phone]    Self-Help/Support Groups Organization         Address  Phone             Notes  [facility] [place] -  variety of support groups  336- I7437963 Call for more information  Narcotics Anonymous (NA), [facility] [address], [employer] [place]  2 meetings at this location   Statistician         Address  Phone  Notes  [facility] [address],    [place]  [phone]   [facility]  [address] [place] [address], [place], [place] [phone]   [facility] [address] [place], [place] [phone] Admissions: 8am-3pm M-F  [facility] [address],    [place], [place] [phone]   [facility] [address] [place], [place], [place] [phone]   [facility] [address],  [place], [place] [phone]   [facility] - Intensive Outpatient [address], [place], [place] [phone]   [facility] ([facility]iction [facility] [address]  [place], [place] [phone] or [phone]   [facility] ([facility]) [address], [place], [place] [phone] Accepts Medicaid  [facility] [place] [address],  [place] [phone] Substance Abuse/Addiction Treatment   [facility] Organization         Address  Phone  Notes  [facility]  [phone]   Angie Fava,  PhD [address] [place], [place]   [phone] or [phone]   [facility]   [address] [place], [place] [phone]   [facility] [address] [place], [place] [phone] Insurance/Medicaid/sponsorship through [facility] and Families [address]                                    [place], [place] [phone] Therapy/tele-psych/case  [facility] [address] [place], [place] [phone]    Dr. Lolly Mustache  [phone]   [facility]  [employer] [facility] Dept. 1) [address] [place] 2) [address], [place] 3)  [address], [place] [phone]  [phone]   [facility] Child Abuse Hotline (215)[phone] or [phone] (After Hours)

## 2015-11-07 NOTE — ED Provider Notes (Signed)
CSN: [id number]     Arrival date & time 11/07/15  1731 History  By signing my name below, I, Octavia Heir, attest that this documentation has been prepared under the direction and in the presence of Yelina Sarratt, PA-C. Electronically Signed: Octavia Heir, ED Scribe. 11/07/2015. 6:14 PM.    Chief Complaint  Patient presents with  . Foot Pain     The history is provided by the patient. No language interpreter was used.   HPI Comments: Franklin Love is a 35 y.o. male who presents to the Emergency Department complaining of constant, moderate, gradual worsening, throbbing and itching left foot pain onset about 3 weeks ago. He notes he was walking around at the mall when he had sudden onset pain. Pt endorses left foot pain and darkening discoloration near his 4th and 5th digits with no radiation. He reports that he has pain without movement but states his pain increases with ambulation. He has not taken any medication to all alleviate the pain. Denies n/v/d, fever, chest pain, shortness of breath, numbness, weakness, trauma, swelling in legs and wearing new shoes.  History reviewed. No pertinent past medical history. History reviewed. No pertinent past surgical history. History reviewed. No pertinent family history. Social History  Substance Use Topics  . Smoking status: Current Every Day Smoker  . Smokeless tobacco: None  . Alcohol Use: Yes    Review of Systems  Constitutional: Negative for fever and chills.  Respiratory: Negative for shortness of breath.   Cardiovascular: Negative for chest pain and leg swelling.  Gastrointestinal: Negative for nausea, vomiting and diarrhea.  Musculoskeletal: Positive for arthralgias. Negative for myalgias.  Skin: Positive for color change. Negative for wound.  Allergic/Immunologic: Negative for immunocompromised state.  Neurological: Negative for weakness and numbness.  Hematological: Does not bruise/bleed easily.  Psychiatric/Behavioral: Negative  for self-injury.      Allergies  Review of patient's allergies indicates no known allergies.  Home Medications   Prior to Admission medications   Medication Sig Start Date End Date Taking? Authorizing Provider  ciprofloxacin (CIPRO) 500 MG tablet Take 1 tablet (500 mg total) by mouth 2 (two) times daily. 01/02/14   Mellody Drown, PA-C  HYDROcodone-acetaminophen (NORCO/VICODIN) 5-325 MG per tablet Take 1 tablet by mouth every 4 (four) hours as needed. 01/02/14   Mellody Drown, PA-C   Triage vitals: BP 137/68 mmHg  Pulse 80  Temp(Src) 98 F (36.7 C) (Oral)  Resp 20  SpO2 100% Physical Exam  Constitutional: He appears well-developed and well-nourished. No distress.  HENT:  Head: Normocephalic and atraumatic.  Neck: Neck supple.  Cardiovascular: Normal rate and intact distal pulses.   Pulmonary/Chest: Effort normal.  Musculoskeletal: Normal range of motion. He exhibits no edema.  White macerated skin between the toes with some thickening of skin between 4th and 5th toes of left foot, thicken nails of 4th and 5th toes, slight edema, tenderness and hyperpigmentation of the soft tissue overlying the 4th and 5th MTP, no areas of fluctuance or induration, no erythema or warmth, no calf edema or tenderness, moves all toes without difficulty, sensation intact, and distal pulses intact.  Neurological: He is alert. He exhibits normal muscle tone.  Skin: He is not diaphoretic.  Psychiatric: He has a normal mood and affect. His behavior is normal.  Nursing note and vitals reviewed.   ED Course  Procedures  DIAGNOSTIC STUDIES: Oxygen Saturation is 100% on RA, normal by my interpretation.  COORDINATION OF CARE:  6:12 PM Discussed treatment plan which includes x-ray  of left foot, treatment for athletes foot, follow up with primary care, and antibiotics with pt at bedside and pt agreed to plan.  Labs Review Labs Reviewed - No data to display  Imaging Review No results found. I have  personally reviewed and evaluated these images and lab results as part of my medical decision-making.   EKG Interpretation None      MDM   Final diagnoses:  Left foot pain  Tinea pedis of left foot    Afebrile, nontoxic patient with atraumatic left foot pain x 3 weeks.  He does have tinea pedis that is more severe between 4th and 5th digits.  The skin is slightly hyperpigmented in this area.  No erythema or warmth but there is edema and tenderness.  Likely pain is related to tinea pedis but will cover with antibiotics for mild cellulitis.  Xray negative.  Neurovascularly intact.   D/C home with lotrimin, keflex, ibuprofen.  Discussed result, findings, treatment, and follow up  with patient.  Pt given return precautions.  Pt verbalizes understanding and agrees with plan.        I personally performed the services described in this documentation, which was scribed in my presence. The recorded information has been reviewed and is accurate.    Trixie Dredgemily Karry Causer, PA-C 11/07/15 1928  Linwood DibblesJon Knapp, MD 11/09/15 1009

## 2015-11-07 NOTE — ED Notes (Signed)
Pt states that he had pain on his L pinky toe x 3 weeks ago that went down but is now worse. Alert and oriented.

## 2015-11-16 ENCOUNTER — Encounter (HOSPITAL_COMMUNITY): Payer: Self-pay | Admitting: Emergency Medicine

## 2015-11-16 ENCOUNTER — Emergency Department (HOSPITAL_COMMUNITY)
Admission: EM | Admit: 2015-11-16 | Discharge: 2015-11-16 | Disposition: A | Discharge status: Home / Self Care | Payer: Self-pay | Attending: Emergency Medicine | Admitting: Emergency Medicine

## 2015-11-16 DIAGNOSIS — Z79899 Other long term (current) drug therapy: Secondary | ICD-10-CM | POA: Insufficient documentation

## 2015-11-16 DIAGNOSIS — M79672 Pain in left foot: Secondary | ICD-10-CM

## 2015-11-16 DIAGNOSIS — F17200 Nicotine dependence, unspecified, uncomplicated: Secondary | ICD-10-CM | POA: Insufficient documentation

## 2015-11-16 DIAGNOSIS — B353 Tinea pedis: Secondary | ICD-10-CM | POA: Insufficient documentation

## 2015-11-16 MED ORDER — CLOTRIMAZOLE 1 % EX CREA: TOPICAL_CREAM | CUTANEOUS | Status: DC

## 2015-11-16 MED ORDER — MELOXICAM 7.5 MG PO TABS: 7.5000 mg | ORAL_TABLET | Freq: Every day | ORAL | Status: DC

## 2015-11-16 NOTE — Discharge Instructions (Signed)
Athlete's Foot Athlete's foot (tinea pedis) is a fungal infection of the skin on the feet. It often occurs on the skin between the toes or underneath the toes. It can also occur on the soles of the feet. Athlete's foot is more likely to occur in hot, humid weather. Not washing your feet or changing your socks often enough can contribute to athlete's foot. The infection can spread from person to person (contagious). CAUSES Athlete's foot is caused by a fungus. This fungus thrives in warm, moist places. Most people get athlete's foot by sharing shower stalls, towels, and wet floors with an infected person. People with weakened immune systems, including those with diabetes, may be more likely to get athlete's foot. SYMPTOMS   Itchy areas between the toes or on the soles of the feet.  White, flaky, or scaly areas between the toes or on the soles of the feet.  Tiny, intensely itchy blisters between the toes or on the soles of the feet.  Tiny cuts on the skin. These cuts can develop a bacterial infection.  Thick or discolored toenails. DIAGNOSIS  Your caregiver can usually tell what the problem is by doing a physical exam. Your caregiver may also take a skin sample from the rash area. The skin sample may be examined under a microscope, or it may be tested to see if fungus will grow in the sample. A sample may also be taken from your toenail for testing. TREATMENT  Over-the-counter and prescription medicines can be used to kill the fungus. These medicines are available as powders or creams. Your caregiver can suggest medicines for you. Fungal infections respond slowly to treatment. You may need to continue using your medicine for several weeks. PREVENTION   Do not share towels.  Wear sandals in wet areas, such as shared locker rooms and shared showers.  Keep your feet dry. Wear shoes that allow air to circulate. Wear cotton or wool socks. HOME CARE INSTRUCTIONS   Take medicines as directed by  your caregiver. Do not use steroid creams on athlete's foot.  Keep your feet clean and cool. Wash your feet daily and dry them thoroughly, especially between your toes.  Change your socks every day. Wear cotton or wool socks. In hot climates, you may need to change your socks 2 to 3 times per day.  Wear sandals or canvas tennis shoes with good air circulation.  If you have blisters, soak your feet in Burow's solution or Epsom salts for 20 to 30 minutes, 2 times a day to dry out the blisters. Make sure you dry your feet thoroughly afterward. SEEK MEDICAL CARE IF:   You have a fever.  You have swelling, soreness, warmth, or redness in your foot.  You are not getting better after 7 days of treatment.  You are not completely cured after 30 days.  You have any problems caused by your medicines. MAKE SURE YOU:   Understand these instructions.  Will watch your condition.  Will get help right away if you are not doing well or get worse.   This information is not intended to replace advice given to you by your health care provider. Make sure you discuss any questions you have with your health care provider.   Document Released: 10/06/2000 Document Revised: 01/01/2012 Document Reviewed: 04/12/2015 Elsevier Interactive Patient Education 2016 [employer]  Emergency Department Resource Guide 1) Find a Doctor and Pay Out of Pocket Although you won't have to find out who is covered by your insurance  plan, it is a good idea to ask around and get recommendations. You will then need to call the office and see if the doctor you have chosen will accept you as a new patient and what types of options they offer for patients who are self-pay. Some doctors offer discounts or will set up payment plans for their patients who do not have insurance, but you will need to ask so you aren't surprised when you get to your appointment.  2) Contact Your Local Health Department Not all health departments have  doctors that can see patients for sick visits, but many do, so it is worth a call to see if yours does. If you don't know where your local health department is, you can check in your phone book. The CDC also has a tool to help you locate your state's health department, and many state websites also have listings of all of their local health departments.  3) Find a Walk-in Clinic If your illness is not likely to be very severe or complicated, you may want to try a walk in clinic. These are popping up all over the country in pharmacies, drugstores, and shopping centers. They're usually staffed by nurse practitioners or physician assistants that have been trained to treat common illnesses and complaints. They're usually fairly quick and inexpensive. However, if you have serious medical issues or chronic medical problems, these are probably not your best option.  No Primary Care Doctor: - Call Health Connect at  [phone] - they can help you locate a primary care doctor that  accepts your insurance, provides certain services, etc. - Physician Referral Service- [phone]  Chronic Pain Problems: Organization         Address  Phone   Notes  [facility]  [phone] Patients need to be referred by their primary care doctor.   Medication Assistance: Organization         Address  Phone   Notes  [facility] Medication [facility] [address] [place] [place], [place] [address] [phone] --Must be a resident of [facility] -- Must have NO insurance coverage whatsoever (no Medicaid/ Medicare, etc.) -- The pt. MUST have a primary care doctor that directs their care regularly and follows them in the community   [facility]  [phone]   [employer]  [phone]    Agencies that provide inexpensive medical care: Organization         Address  Phone   Notes  [facility]  [phone]   [facility]    [phone]    [facility] [address] [place], [place] [address] [phone]   [facility] [address] [place] [phone]   Planned Parenthood    [phone]   [facility]    [phone]   [facility]  [address], [place] Phone:  [phone], Fax:  [phone] Hours of Operation:  9 am - 6 pm, M-F.  Also accepts Medicaid/Medicare and self-pay.  [facility]  [address], [place] Phone: [phone], Fax: [phone]. Hours of Operation:  8:30 am - 5:30 pm, M-F.  Also accepts Medicaid and self-pay.  [facility] [place] [address] [place] Phone: [phone]   [facility] [address] Natasha Bence [place], [place] [phone], Ext. [phone] Mondays & Thursdays:  7-9 AM.  First 15 patients are seen on a first come, first serve basis.    Medicaid-accepting [facility] Providers:  Organization         Address  Phone   Notes  [facility] [address], [place] [phone] Also accepts self-pay patients.  [facility] [address] Laurell Josephs [place], [place]  [phone]   [facility] [address], [place] [phone]   [facility] [address] [place] [phone]   Renaye Rakers [address], [place]   [phone] Only accepts [place] Access [place] patients after they have their name applied to their card.   Self-Pay (no insurance) in [facility]:  Organization         Address  Phone   Notes  Sickle Cell Patients, [facility] [address] [place], [place] [phone]   [facility] [address] [place], [place] [phone]   [facility]  [address], [place] [phone]   [facility]/Dr. Osei-Bonsu  [address], [place] or [address], [place] [phone] Phone number for both [place] and [place] locations is the same.  [facility] [address] [place] [phone]   [facility] [address] [place] or [address] [phone]   [facility] [address] [place] [phone], phone; [phone], fax Sees patients 1st and 3rd Saturday of every month.  Must not qualify for public or private insurance (i.e. Medicaid, Medicare, [place] Health Choice, Veterans' Benefits)  Household income should be no more than 200% of the poverty level The clinic cannot treat you if you are pregnant or think you are pregnant  Sexually transmitted diseases are not treated at the clinic.    Dental Care: Organization         Address  Phone  Notes  [facility] Department of [facility] [address] [place], [place] [phone] Accepts children up to age 66 who are enrolled in [place] or [place] Health Choice; pregnant women with a Medicaid card; and children who have applied for Medicaid or [place] Health Choice, but were declined, whose parents can pay a reduced fee at time of service.  [facility] Department of [facility]  [address], [place] [phone] Accepts children up to age 64 who are enrolled in [place] or [place] Health Choice; pregnant women with a Medicaid card; and children who have applied for Medicaid or [place] Health Choice, but were declined, whose parents can pay a reduced fee at time of service.  [facility]  [address] [place], [place] [phone] Patients are seen by appointment only. Walk-ins are not accepted. [facility] will see patients 41 years of age and older. Monday - Tuesday (8am-5pm) Most Wednesdays (8:30-5pm) $30 per visit, cash only  [facility]  Adult Dental Access PROGRAM  [address], [facility] [phone] Patients are seen by appointment only. Walk-ins are not accepted. [facility] will see patients 80 years of age and older. One Wednesday Evening (Monthly: Volunteer Based).  $30 per visit, cash only  [employer]  [phone] for adults; Children under age  4, call Graduate Pediatric Dentistry at [phone]. Children aged 46-14, please call [phone] to request a pediatric application.  Dental services are provided in all areas of dental care including fillings, crowns and bridges, complete and partial dentures, implants, gum treatment, root canals, and extractions. Preventive care is also provided. Treatment is provided to both adults and children. Patients are selected via a lottery and there is often a waiting list.   [facility] [address] [place]  [phone] [email or web address]   [facility] [address] [place], [place] [phone], Ext. [phone] Second and Fourth Thursday of each month, opens at 6:30 AM; Clinic ends at 9 AM.  Patients are seen on a first-come first-served basis, and a limited number are seen during each clinic.   [facility]  [address] Ether Griffins [place], [place] [phone]   Eligibility Requirements You must have lived in [place], [place], or [place] counties for at least the last three months.   You cannot be eligible for state or federal sponsored [place], including [employer], [place], or [employer].   You generally cannot be eligible for healthcare insurance through your employer.    How to apply: Eligibility screenings are held every Tuesday and Wednesday afternoon from 1:00 pm until 4:00 pm. You do not need an appointment for the interview!  [facility] [address], [place], [place] [phone]   [facility] Health Department  [phone]   [facility] Health Department  [phone]   [facility] Health Department  [phone]    Behavioral Health Resources in the Community: Intensive Outpatient Programs Organization         Address  Phone  Notes  [facility] Services [address], [place], [place] [phone]   [facility]patient [address] [place], [place] [phone]   [facility] [address] [place], [place]  [phone]   [facility] Mental Health [address],  [place], [place] [phone] or [phone]   Substance Abuse Resources Organization         Address  Phone  Notes  [facility]  [phone]   [facility]  [phone]   [facility]  [phone]   Floydene Flock  [phone]   Residential & Outpatient Substance Abuse Program  [phone]   Psychological Services Organization         Address  Phone  Notes  [facility] Behavioral Health  [phone]   [facility] Services  [phone]   [facility] Mental Health [address], [place] [phone] or [phone]    Mobile Crisis Teams Organization         Address  Phone  Notes  [facility], Mobile Crisis Care Unit  [phone]   [facility]  [address] [place], [place] [phone]   Doristine Locks [address] [place] [phone]    Self-Help/Support Groups Organization         Address  Phone             Notes  [facility] [place] - variety of support groups  336- I7437963 Call for more information  Narcotics Anonymous (NA), [facility] [address], [employer] [place]  2 meetings at this location   Statistician         Address  Phone  Notes  [facility] [address],  [place]  [phone]   [facility]  [address] [place] [address], [place], [place] [phone]   [facility] Treatment  Facility [address] [place], [place] [phone] Admissions: 8am-3pm M-F  [facility] [address]    [place], [place] [phone]   [facility] [address] [place], [place], [place] [phone]   [facility] [address],  [place], [place] [phone]   [facility] - Intensive Outpatient [address] Laurell Josephs [address], [place], [place] [phone]   [facility] ([facility]iction [facility] [address] [place]  [place], [place] [phone] or [phone]   [facility] ([facility]) [address], [place], [place] [phone] Accepts Medicaid  [facility] [address]  [place] [phone] Substance Abuse/Addiction Treatment   [facility] Organization         Address  Phone  Notes  [facility]  [phone]   Angie Fava, PhD [address] Ervin Knack [place], [place]   [phone] or ([phone]   [facility] Behavioral   [address] [place], [place] [phone]   [facility] [address], [place], [place] [phone] Insurance/Medicaid/sponsorship through [facility] and Families [address]                                    [place], [place] [phone] Therapy/tele-psych/case  [facility] [address], [place] [phone]    Dr. Lolly Mustache  [phone]   [facility] [place]  [employer] [facility] Dept. 1) [address] [place] 2) [address], [place] 3)  [address], [place] [phone]  [phone]   [facility] Child Abuse Hotline 586-333-9998 or [phone] (After Hours)

## 2015-11-16 NOTE — ED Provider Notes (Signed)
CSN: [id number]     Arrival date & time 11/16/15  1311 History  By signing my name below, I, Jarvis Morgan, attest that this documentation has been prepared under the direction and in the presence of Cheri Fowler, PA-C Electronically Signed: Jarvis Morgan, ED Scribe. 11/16/2015. 2:17 PM.     Chief Complaint  Patient presents with  . Foot Pain   The history is provided by the patient. No language interpreter was used.    HPI Comments: Franklin Love is a 35 y.o. male who presents to the Emergency Department complaining of constant, moderate, gradually worsening, burning, left foot pain for 1 month. Pt was seen 2 weeks ago for the same and was dx with tinea pedis and prescribed Lotrimin, Keflex and Ibuprofen; pt notes he has been taking the medication with no significant relief.  Pt had an x-ray done 2 weeks ago which came back negative. He reports the pain is exacerbated with bearing weight. Pt denies any new injury or trauma to the foot. He denies fever, chills, nausea, vomiting, tingling, weakness or other associated symptoms at this time.  History reviewed. No pertinent past medical history. History reviewed. No pertinent past surgical history. No family history on file. Social History  Substance Use Topics  . Smoking status: Current Every Day Smoker  . Smokeless tobacco: None  . Alcohol Use: Yes    Review of Systems A complete 10 system review of systems was obtained and all systems are negative except as noted in the HPI and PMH.     Allergies  Review of patient's allergies indicates no known allergies.  Home Medications   Prior to Admission medications   Medication Sig Start Date End Date Taking? Authorizing Provider  ibuprofen (ADVIL,MOTRIN) 600 MG tablet Take 1 tablet (600 mg total) by mouth every 8 (eight) hours as needed for mild pain or moderate pain. 11/07/15  Yes Trixie Dredge, PA-C  clotrimazole (LOTRIMIN) 1 % cream Apply to affected area 2 times daily 11/07/15   Trixie Dredge, PA-C  clotrimazole (LOTRIMIN) 1 % cream Apply to affected area 2 times daily 11/16/15   Cheri Fowler, PA-C  meloxicam (MOBIC) 7.5 MG tablet Take 1 tablet (7.5 mg total) by mouth daily. 11/16/15   Kamori Kitchens, PA-C   BP 145/80 mmHg  Pulse 60  Temp(Src) 97.8 F (36.6 C) (Oral)  Resp 16  Ht  (1.702 m)  Wt 94.348 kg  BMI 32.57 kg/m2  SpO2 98%   Physical Exam  Constitutional: He is oriented to person, place, and time. He appears well-developed and well-nourished.  HENT:  Head: Atraumatic.  Eyes: Conjunctivae are normal. No scleral icterus.  Neck: No tracheal deviation present.  Cardiovascular: Intact distal pulses.   Pulses:      Dorsalis pedis pulses are 2+ on the right side, and 2+ on the left side.  Pulmonary/Chest: Effort normal. No respiratory distress.  Musculoskeletal: Normal range of motion.  Moves all toes without difficulty.  Neurological: He is alert and oriented to person, place, and time.  Sensation and strength intact bilaterally throughout lower extremities.  Skin: Skin is warm and dry.  White flaking skin between 4th and 5th toes and thickening of nails of 4th and 5th toes, no edema, erythema, warmth, fluctuance, or induration.    Psychiatric: He has a normal mood and affect. His behavior is normal.    ED Course  Procedures (including critical care time)  DIAGNOSTIC STUDIES: Oxygen Saturation is 98% on RA, normal by my interpretation.  COORDINATION OF CARE:   Labs Review Labs Reviewed - No data to display  Imaging Review No results found.    EKG Interpretation None      MDM   Final diagnoses:  Tinea pedis of left foot  Left foot pain    She was seen here on 11/07/2015 for the same left foot pain he was discharged with Lotrimin, ibuprofen, and Keflex. Patient has been compliant with all medications.  He states his pain is somewhat better.  No injury/trauma.  Plain films 11/07/15 negative.  Denies f/c/n/v/d.  VSS, NAD.  On exam, evidence of  tinea pedis between 4th and 5th toes.  No overlying erythema, warmth, induration, or fluctuance.  Doubt cellulitis or abscess.  Patient advised to continue Lotrimin, and discussed that it takes time for tinea pedis to be completely cleared (up to 4 weeks or more).  Will try Mobic for pain.  Discussed importance of following up with PCP for recheck.  Discussed return precautions.  Patient agrees and acknowledges the above plan for discharge.  I personally performed the services described in this documentation, which was scribed in my presence. The recorded information has been reviewed and is accurate.     Cheri Fowler, PA-C 11/16/15 1419  Nelva Nay, MD 11/17/15 [phone]

## 2015-11-16 NOTE — ED Notes (Signed)
C/o left foot pain X 1 month with no trauma, no other complaints, NAD

## 2015-11-16 NOTE — ED Notes (Signed)
Pt ambulates independently and with steady gait at time of discharge. Discharge instructions and follow up information reviewed with patient. No other questions or concerns voiced at this time. RX x 2 given. 

## 2015-12-10 ENCOUNTER — Encounter (HOSPITAL_COMMUNITY): Payer: Self-pay | Admitting: *Deleted

## 2015-12-10 ENCOUNTER — Emergency Department (HOSPITAL_COMMUNITY)
Admission: EM | Admit: 2015-12-10 | Discharge: 2015-12-10 | Disposition: A | Discharge status: Home / Self Care | Payer: Self-pay | Attending: Physician Assistant | Admitting: Physician Assistant

## 2015-12-10 ENCOUNTER — Emergency Department (HOSPITAL_COMMUNITY): Payer: Self-pay

## 2015-12-10 DIAGNOSIS — X58XXXA Exposure to other specified factors, initial encounter: Secondary | ICD-10-CM | POA: Insufficient documentation

## 2015-12-10 DIAGNOSIS — S93601A Unspecified sprain of right foot, initial encounter: Secondary | ICD-10-CM | POA: Insufficient documentation

## 2015-12-10 DIAGNOSIS — F17200 Nicotine dependence, unspecified, uncomplicated: Secondary | ICD-10-CM | POA: Insufficient documentation

## 2015-12-10 DIAGNOSIS — Y9367 Activity, basketball: Secondary | ICD-10-CM | POA: Insufficient documentation

## 2015-12-10 DIAGNOSIS — Z79899 Other long term (current) drug therapy: Secondary | ICD-10-CM | POA: Insufficient documentation

## 2015-12-10 DIAGNOSIS — Z791 Long term (current) use of non-steroidal anti-inflammatories (NSAID): Secondary | ICD-10-CM | POA: Insufficient documentation

## 2015-12-10 DIAGNOSIS — T148XXA Other injury of unspecified body region, initial encounter: Secondary | ICD-10-CM

## 2015-12-10 DIAGNOSIS — Y998 Other external cause status: Secondary | ICD-10-CM | POA: Insufficient documentation

## 2015-12-10 DIAGNOSIS — Y9289 Other specified places as the place of occurrence of the external cause: Secondary | ICD-10-CM | POA: Insufficient documentation

## 2015-12-10 MED ORDER — IBUPROFEN 800 MG PO TABS
800.0000 mg | ORAL_TABLET | Freq: Once | ORAL | Status: AC
  Administered 2015-12-10: 800 mg via ORAL
  Filled 2015-12-10: qty 1

## 2015-12-10 MED ORDER — IBUPROFEN 800 MG PO TABS: 800.0000 mg | ORAL_TABLET | Freq: Three times a day (TID) | ORAL | Status: DC

## 2015-12-10 NOTE — Discharge Instructions (Signed)
You have been seen today for foot pain. Your imaging showed no abnormalities. It is likely that you have a sprain. Take ibuprofen every 8 hours for the next 5 days. Elevate the foot whenever possible. Apply ice for 15 minutes at a time every hour. Follow up with PCP as needed. Return to ED should symptoms worsen.   Emergency Department Resource Guide 1) Find a Doctor and Pay Out of Pocket Although you won't have to find out who is covered by your insurance plan, it is a good idea to ask around and get recommendations. You will then need to call the office and see if the doctor you have chosen will accept you as a new patient and what types of options they offer for patients who are self-pay. Some doctors offer discounts or will set up payment plans for their patients who do not have insurance, but you will need to ask so you aren't surprised when you get to your appointment.  2) Contact Your Local Health Department Not all health departments have doctors that can see patients for sick visits, but many do, so it is worth a call to see if yours does. If you don't know where your local health department is, you can check in your phone book. The CDC also has a tool to help you locate your state's health department, and many state websites also have listings of all of their local health departments.  3) Find a Walk-in Clinic If your illness is not likely to be very severe or complicated, you may want to try a walk in clinic. These are popping up all over the country in pharmacies, drugstores, and shopping centers. They're usually staffed by nurse practitioners or physician assistants that have been trained to treat common illnesses and complaints. They're usually fairly quick and inexpensive. However, if you have serious medical issues or chronic medical problems, these are probably not your best option.  No Primary Care Doctor: - Call Health Connect at  [phone] - they can help you locate a primary care  doctor that  accepts your insurance, provides certain services, etc. - Physician Referral Service- [phone]  Chronic Pain Problems: Organization         Address  Phone   Notes  [facility]  [phone] Patients need to be referred by their primary care doctor.   Medication Assistance: Organization         Address  Phone   Notes  [facility] Medication [facility] [address] [place] [place], [place] [address] [phone] --Must be a resident of [facility] -- Must have NO insurance coverage whatsoever (no Medicaid/ Medicare, etc.) -- The pt. MUST have a primary care doctor that directs their care regularly and follows them in the community   [facility]ist  [phone]   [employer]  [phone]    Agencies that provide inexpensive medical care: Organization         Address  Phone   Notes  [facility]  [phone]   [facility]    [phone]   [facility] [address] [place], [place] [address] [phone]   [facility] [address] [place], [place] [phone]   Planned Parenthood    [phone]   [facility]    [phone]   [facility]  [address], [place] Phone:  [phone], Fax:  [phone] Hours of Operation:  9 am - 6 pm, M-F.  Also accepts Medicaid/Medicare and self-pay.  [facility] for [place], [address], [place] Phone: [phone], Fax: [phone]. Hours of Operation:  8:30 am - 5:30 pm, M-F.  Also accepts Medicaid and self-pay.  [facility] [address] [place] Phone: [phone]   [place], [place], [place] [phone], Ext. [phone] Mondays & Thursdays: 7-9 AM.  First 15 patients are seen on a first come, first serve basis.    [place]  Providers:  Organization         Address  Phone   Notes  [facility] [address], [place] [phone] Also accepts self-pay patients.  [facility] [address], [place]  [phone]   [place], [address], [place] [phone]   [facility] [address], [place] [phone]   Lucianne Lei [address], [place]   [phone] Only accepts [place] Access [place] patients after they have their name applied to their card.   Self-Pay (no insurance) in [facility]:  Organization         Address  Phone   Notes  Sickle Cell Patients, [facility] Internal Medicine [place] [phone]   [facility] [place] [phone]   [facility] [place]  [place], [place], [place] [phone]   [facility]/Dr. Osei-Bonsu  [address], [place] or [address], [place] [phone] Phone number for both [place] and [place] locations is the same.  Urgent Medical and [facility] [address], [place] [phone]   [facility] [address] [place] or [address] [phone]   [facility] [address], [place] [phone], phone; [phone], fax Sees patients 1st and 3rd Saturday of every month.  Must not qualify for public or private insurance (i.e. Medicaid, Medicare, [place] Health Choice, Veterans' Benefits)  Household income should be no more than 200% of the poverty level The clinic cannot treat you if you are pregnant or think you are pregnant  Sexually transmitted diseases are not treated at the clinic.    Dental Care: Organization         Address  Phone  Notes  [facility] Department of [facility] [place] [phone] Accepts children up to age 38 who are enrolled in [place] or [place]; pregnant women with a Medicaid card; and children who have applied for Medicaid or [place] Health Choice, but were declined, whose parents can pay a reduced fee at time of service.  [facility] Department of [facility]  [address], [place] [phone] Accepts children up to age 17 who are enrolled in [place] or [place]; pregnant women with a Medicaid card; and children who have applied for Medicaid or [place] Health Choice, but were declined, whose parents can pay a reduced fee at time of service.  [facility] Adult [facility] Access PROGRAM  [place] [phone] Patients are seen by appointment only. Walk-ins are not accepted. [facility] will see patients 74 years of age and older. Monday - Tuesday (8am-5pm) Most Wednesdays (8:30-5pm) $30 per visit, cash only  [facility] Adult Dental Access PROGRAM  [address], [facility] [phone] Patients are seen by appointment only. Walk-ins are not accepted. [place] will see patients 50 years of age and older. One Wednesday Evening (Monthly: Volunteer Based).  $30 per visit, cash only  [place]  [phone] for adults; Children under age 23, call Graduate Pediatric Dentistry at [phone]. Children aged 20-14, please call [phone] to request a pediatric application.  Dental services are provided in all areas of dental care including fillings, crowns and bridges, complete and partial dentures, implants, gum treatment, root canals, and extractions. Preventive care is also provided. Treatment is provided to both adults and children. Patients are selected via a lottery and there is often a waiting list.   [facility] [address], [place]  [phone] [email or web address]   [facility]  [address] [place], [place] [phone], Ext. [phone] Second and Fourth Thursday of each month, opens at 6:30 AM; Clinic ends at 9 AM.  Patients are seen on a first-come first-served basis, and a limited number are seen during each clinic.   [facility]  [address] Hillard Danker [place], [place] [phone]   Eligibility Requirements You must have lived in [place], [place], or [place] counties for at least the last three months.   You cannot be eligible for state or federal sponsored [employer], including [employer], [place], or [employer].   You generally cannot be eligible for healthcare insurance through your employer.    How to apply: Eligibility screenings are held every Tuesday and Wednesday afternoon from 1:00 pm until 4:00 pm. You do not need an appointment for the interview!  [facility] [address], [place], [place]   [place]  [place] Department  [place]  [phone]    Behavioral Health Resources in the Community: Intensive Outpatient Programs Organization         Address  Phone  Notes  [place]. [address] [place], [place] [phone]   [facility] Outpatient [address] [place], [place]   ADS: [facility] Drug [facility] [address], [place], [place]   [place] [address]  [place], [place] or [phone]   Substance Abuse Resources Organization         Address  Phone  Notes  Alcohol and Drug Services  [phone]   [place]  [phone]   [facility] [place]   Chinita Pester  [phone]   Residential & Outpatient Substance Abuse Program  [phone]   Psychological Services Organization         Address  Phone  Notes  [facility] [place]  [place]  [phone]   [place] [address] [place] or [phone]    Mobile Crisis Teams Organization         Address  Phone  Notes  [facility], Mobile Crisis Care Unit  [phone]   [facility]  [address] [place], [place]   [facility] [address] [place] [phone]    Self-Help/Support Groups Organization  Address  Phone             Notes  [place]. of [place] - variety of support groups  [place] Call for more information  Narcotics Anonymous (NA), [facility] [address], [employer] [place]  2 meetings at this location   Special educational needs teacher         Address  Phone  Notes  ASAP Residential Treatment [place],    [place]  [phone]   [facility]  [address], [place] [phone], [place], [place]   [place], [place] [phone] Admissions: 8am-3pm M-F  Incentives Substance [place] [address],    [place], [place] [phone]   [facility] [address] [place], [place], [place]   [facility] [address],  [place], [place]   [facility] - Intensive Outpatient [address], [place], [place]   [facility]  [place], [place] [phone] or [phone]   [facility] (RTS) [address] [place], [place] Accepts Medicaid  [facility] [place] [address]  [place] [phone] Substance Abuse/Addiction Treatment   [facility] Organization         Address  Phone  Notes  [facility]  [phone]   Domenic Schwab, PhD [address] Arlis Porta [place], [place]   [phone] or [phone]    [place], [place] [phone]   [facility] [address], [place], [place] [phone] Insurance/Medicaid/sponsorship through [facility] and Families [address]                                    [place], [place] [phone] [place] [address], [place] [phone]    Dr. Adele Schilder  [phone]   [facility] Dept. 1) [address], [place] 2) [place] 3)  [place] [address], [place] [phone]  [phone]   [place] [phone] or [phone] (After Hours)

## 2015-12-10 NOTE — ED Notes (Signed)
Ortho tech at bedside 

## 2015-12-10 NOTE — ED Provider Notes (Signed)
CSN: [id number]     Arrival date & time 12/10/15  1543 History  By signing my name below, I, Franklin Love attest that this documentation has been prepared under the direction and in the presence of Shawn C. Joy, PA-C. Electronically Signed: Linus Love, ED Scribe. 12/10/2015. 6:02 PM.   Chief Complaint  Patient presents with  . Foot Injury    rt   The history is provided by the patient. No language interpreter was used.    HPI Comments: Franklin Love is a 35 y.o. male with no past PMHx who presents to the Emergency Department complaining of right foot pain with associated swelling that began last night. Pt rates his pain a 5/10, describes it as a throbbing, nonradiating. Pain is worse with weightbearing. Patient has not taken anything for the pain. Pt states that he was playing basketball last night when he began having these symptoms. Pt denies any numbness, weakness, trauma or falls, or any other symptoms at this time.   History reviewed. No pertinent past medical history. History reviewed. No pertinent past surgical history. No family history on file.   Social History  Substance Use Topics  . Smoking status: Current Every Day Smoker  . Smokeless tobacco: None  . Alcohol Use: Yes    Review of Systems  Musculoskeletal: Positive for arthralgias.       + foot pain, + swelling  Skin: Negative for color change and pallor.  Neurological: Negative for weakness and numbness.    Allergies  Review of patient's allergies indicates no known allergies.  Home Medications   Prior to Admission medications   Medication Sig Start Date End Date Taking? Authorizing Provider  clotrimazole (LOTRIMIN) 1 % cream Apply to affected area 2 times daily 11/07/15   Trixie Dredge, PA-C  clotrimazole (LOTRIMIN) 1 % cream Apply to affected area 2 times daily 11/16/15   Cheri Fowler, PA-C  ibuprofen (ADVIL,MOTRIN) 600 MG tablet Take 1 tablet (600 mg total) by mouth every 8 (eight) hours as needed for mild pain  or moderate pain. 11/07/15   Trixie Dredge, PA-C  ibuprofen (ADVIL,MOTRIN) 800 MG tablet Take 1 tablet (800 mg total) by mouth 3 (three) times daily. 12/10/15   Shawn C Joy, PA-C  meloxicam (MOBIC) 7.5 MG tablet Take 1 tablet (7.5 mg total) by mouth daily. 11/16/15   Kayla Rose, PA-C   BP 128/83 mmHg  Pulse 74  Temp(Src) 98 F (36.7 C) (Oral)  Resp 14  Ht  (1.702 m)  Wt 95.255 kg  BMI 32.88 kg/m2  SpO2 100%    Physical Exam  Constitutional: He is oriented to person, place, and time. He appears well-developed and well-nourished.  HENT:  Head: Normocephalic and atraumatic.  Eyes: Conjunctivae are normal.  Neck: Normal range of motion. Neck supple.  Cardiovascular: Normal rate.   Pulmonary/Chest: Effort normal. No respiratory distress.  Musculoskeletal:  Minor swelling and tenderness to the proximal superior foot just distal to the ankle joint. No instabilities, crepitus, or deformities. CMS intact distally.   Neurological: He is alert and oriented to person, place, and time.  Skin: Skin is warm and dry.  Psychiatric: He has a normal mood and affect.  Nursing note and vitals reviewed.   ED Course  Procedures  DIAGNOSTIC STUDIES: Oxygen Saturation is 100% on RA, normal by my interpretation.    COORDINATION OF CARE: 6:02 PM Discussed treatment plan with pt at bedside, including scheduled ibuprofen dosage icing the affected area, and elevation of extremity.. Return precautions  noted. Pt agreed to plan.  Imaging Review Dg Foot Complete Right  12/10/2015  CLINICAL DATA:  Pain at dorsal arch of RIGHT foot with some swelling after playing basketball last night EXAM: RIGHT FOOT COMPLETE - 3+ VIEW COMPARISON:  None FINDINGS: Osseous mineralization normal. Joint space narrowing with spur formation at the naviculocuneiform joint. Joint spaces otherwise preserved. Small calcific body adjacent to the lateral aspect base of proximal phalanx great toe, appears corticated/old. No acute fracture,  dislocation, or bone destruction. 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 No acute osseous abnormalities. Electronically Signed   By: Ulyses Southward M.D.   On: 12/10/2015 17:00   I have personally reviewed and evaluated these images and lab results as part of my medical decision-making.  MDM   Final diagnoses:  Sprain    Franklin Love presents with right foot pain that began last night.  Patient had a clear foot x-ray. Suspect a sprain. Compression, elevation, ice, and anti-inflammatory medications were prescribed for the patient. The patient was given instructions for home care as well as return precautions. Patient voices understanding of these instructions, accepts the plan, and is comfortable with discharge.  I personally performed the services described in this documentation, which was scribed in my presence. The recorded information has been reviewed and is accurate.    Anselm Pancoast, PA-C 12/10/15 2011  Courteney Lyn Mackuen, MD 12/10/15 2248

## 2015-12-10 NOTE — ED Notes (Signed)
Pt states he was playing basket ball last night, started having rt foot pain, good pulse

## 2017-12-21 ENCOUNTER — Encounter (HOSPITAL_COMMUNITY): Payer: Self-pay | Admitting: Emergency Medicine

## 2017-12-21 ENCOUNTER — Other Ambulatory Visit: Payer: Self-pay

## 2017-12-21 ENCOUNTER — Emergency Department (HOSPITAL_COMMUNITY)
Admission: EM | Admit: 2017-12-21 | Discharge: 2017-12-21 | Disposition: A | Discharge status: Home / Self Care | Payer: Self-pay | Attending: Emergency Medicine | Admitting: Emergency Medicine

## 2017-12-21 DIAGNOSIS — F17200 Nicotine dependence, unspecified, uncomplicated: Secondary | ICD-10-CM | POA: Insufficient documentation

## 2017-12-21 DIAGNOSIS — M546 Pain in thoracic spine: Secondary | ICD-10-CM | POA: Insufficient documentation

## 2017-12-21 NOTE — ED Provider Notes (Signed)
[facility]   CSN: [id number] Arrival date & time: 12/21/17  1344     History   Chief Complaint Chief Complaint  Patient presents with  . Back Pain    HPI Jake SeatsMarquis Keator is a 37 y.o. male who presents today for evaluation of right-sided thoracic back pain for approximately 2 weeks.  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  He reports that he did not have any specific injuries prior to the onset of his pain.  He reports that it feels tight and cramping.  He reports that it has been gradually getting worse.  He reports that yesterday he took Aleve, stating that he took about 1 pill every hour for 12 hours.  He reports that he is still urinating normally.  He has not taken any other NSAIDS or tylenol.   HPI  History reviewed. No pertinent past medical history.  There are no active problems to display for this patient.   History reviewed. No pertinent surgical history.     Home Medications    Prior to Admission medications   Medication Sig Start Date End Date Taking? Authorizing Provider  clotrimazole (LOTRIMIN) 1 % cream Apply to affected area 2 times daily 11/07/15   ChadWest, Emily, PA-C  clotrimazole (LOTRIMIN) 1 % cream Apply to affected area 2 times daily 11/16/15   Cheri Fowlerose, Kayla, PA-C  ibuprofen (ADVIL,MOTRIN) 600 MG tablet Take 1 tablet (600 mg total) by mouth every 8 (eight) hours as needed for mild pain or moderate pain. 11/07/15   Trixie DredgeWest, Emily, PA-C  ibuprofen (ADVIL,MOTRIN) 800 MG tablet Take 1 tablet (800 mg total) by mouth 3 (three) times daily. 12/10/15   Joy, Shawn C, PA-C  meloxicam (MOBIC) 7.5 MG tablet Take 1 tablet (7.5 mg total) by mouth daily. 11/16/15   Cheri Fowlerose, Kayla, PA-C    Family History No family history on file.  Social History Social History   Tobacco Use  . Smoking status: Current Every Day Smoker  . Smokeless tobacco: Never Used  Substance Use Topics  .  Alcohol use: Yes  . Drug use: No     Allergies   Patient has no known allergies.   Review of Systems Review of Systems  Constitutional: Negative for chills and fever.  Respiratory: Negative for cough, chest tightness and shortness of breath.   Genitourinary: Negative for decreased urine volume, difficulty urinating and frequency.  Musculoskeletal: Positive for back pain.  Neurological: Negative for weakness and numbness.     Physical Exam Updated Vital Signs BP 123/70 (BP Location: Right Arm)   Pulse (!) 51   Temp 98.1 F (36.7 C) (Oral)   Resp 16   SpO2 100%   Physical Exam  Constitutional: He is oriented to person, place, and time. He appears well-developed and well-nourished.  HENT:  Head: Normocephalic and atraumatic.  Cardiovascular: Normal rate, regular rhythm and normal heart sounds.  Pulmonary/Chest: Effort normal and breath sounds normal. No respiratory distress. He has no wheezes.  Abdominal: Soft. Bowel sounds are normal. He exhibits no distension.  Musculoskeletal:  There is tenderness to palpation along right scapula, worse on the medial border.  Palpation of this area both recreates and exacerbates his pain.  He has 5/5 strength in bilateral upper and lower extremities.  Full right arm ROM with out pain except for when placing right hand on small of back.   Neurological: He is alert and oriented to  person, place, and time.  Skin: Skin is warm and dry. He is not diaphoretic.  Nursing note and vitals reviewed.    ED Treatments / Results  Labs (all labs ordered are listed, but only abnormal results are displayed) Labs Reviewed - No data to display  EKG  EKG Interpretation None       Radiology No results found.  Procedures Procedures (including critical care time)  Medications Ordered in ED Medications - No data to display   Initial Impression / Assessment and Plan / ED Course  I have reviewed the triage vital signs and the nursing  notes.  Pertinent labs & imaging results that were available during my care of the patient were reviewed by me and considered in my medical decision making (see chart for details).    Cort Mcdaris presents today for evaluation of 2 weeks of thoracic back pain.  No fevers, chills, or shortness of breath.  He does not have any numbness, tingling, or weakness.  His pain is both re-created and exacerbated with palpation along the medial aspect of the scapula.  I suspect muscle spasm.    He took about 12 aleve over a 12 hour period yesterday to attempt to relieve his pain.  He denies any urinary changes.  He was given strict instructions to avoid NSAIDS for at least one week.  He was informed he may take tylenol only.  Educated on basic conservative measures and stated his understanding.  CXR not indicated at this time.  Given strict return precautions, especially regarding renal symptoms and stated understanding.  PCP follow up as needed.   Final Clinical Impressions(s) / ED Diagnoses   Final diagnoses:  Acute right-sided thoracic back pain    ED Discharge Orders    None       Cristina Gong, Cordelia Poche 12/21/17 1816    Gwyneth Sprout, MD 12/22/17 [phone]

## 2017-12-21 NOTE — ED Notes (Signed)
Called 3x with no response.

## 2017-12-21 NOTE — Discharge Instructions (Signed)
For the next weak do not take any ibuprofen, Advil, Aleve, naproxen, aspirin or any products that contain these ingredients or other NSAIDs.  If you develop decreased urination or do not feel well then please get additional medical care.  Please make sure you are drinking plenty of water to help your kidneys flush these medications out of your system.    Please take Tylenol (acetaminophen) to relieve your pain.  You may take tylenol, up to 1,000 mg (two extra strength pills).  Do not take more than 3,000 mg tylenol in a 24 hour period.  Please check all medication labels as many medications such as pain and cold medications may contain tylenol. Please do not drink alcohol while taking this medication as this, or taking too much tylenol, can damage your liver.

## 2017-12-21 NOTE — ED Triage Notes (Signed)
Back pain and shoulder pain rt side x 2 weeks  No n/v  , states hurts to move

## 2017-12-21 NOTE — ED Notes (Signed)
Patient is A&Ox4 at this time.  Patient in no signs of distress.  Please see providers note for complete history and physical exam.  

## 2017-12-21 NOTE — ED Notes (Signed)
Patient Alert and oriented to baseline. Stable and ambulatory to baseline. Patient verbalized understanding of the discharge instructions.  Patient belongings were taken by the patient.   

## 2017-12-25 ENCOUNTER — Other Ambulatory Visit: Payer: Self-pay

## 2017-12-25 ENCOUNTER — Emergency Department (HOSPITAL_COMMUNITY): Payer: Self-pay

## 2017-12-25 ENCOUNTER — Encounter (HOSPITAL_COMMUNITY): Payer: Self-pay | Admitting: Emergency Medicine

## 2017-12-25 ENCOUNTER — Emergency Department (HOSPITAL_COMMUNITY)
Admission: EM | Admit: 2017-12-25 | Discharge: 2017-12-25 | Disposition: A | Discharge status: Home / Self Care | Payer: Self-pay | Attending: Emergency Medicine | Admitting: Emergency Medicine

## 2017-12-25 DIAGNOSIS — R0789 Other chest pain: Secondary | ICD-10-CM | POA: Insufficient documentation

## 2017-12-25 DIAGNOSIS — Z79899 Other long term (current) drug therapy: Secondary | ICD-10-CM | POA: Insufficient documentation

## 2017-12-25 DIAGNOSIS — F17210 Nicotine dependence, cigarettes, uncomplicated: Secondary | ICD-10-CM | POA: Insufficient documentation

## 2017-12-25 LAB — BASIC METABOLIC PANEL
ANION GAP: 11 (ref 5–15)
BUN: 18 mg/dL (ref 6–20)
CHLORIDE: 104 mmol/L (ref 101–111)
CO2: 24 mmol/L (ref 22–32)
Calcium: 9.2 mg/dL (ref 8.9–10.3)
Creatinine, Ser: 1.25 mg/dL — ABNORMAL HIGH (ref 0.61–1.24)
GFR calc Af Amer: >60 mL/min (ref >60)
GFR calc non Af Amer: >60 mL/min (ref >60)
Glucose, Bld: 92 mg/dL (ref 65–99)
POTASSIUM: 3.9 mmol/L (ref 3.5–5.1)
SODIUM: 139 mmol/L (ref 135–145)

## 2017-12-25 LAB — CBC
HEMATOCRIT: 39.4 % (ref 39.0–52.0)
HEMOGLOBIN: 12.5 g/dL — AB (ref 13.0–17.0)
MCH: 29.5 pg (ref 26.0–34.0)
MCHC: 31.7 g/dL (ref 30.0–36.0)
MCV: 92.9 fL (ref 78.0–100.0)
Platelets: 231 10*3/uL (ref 150–400)
RBC: 4.24 MIL/uL (ref 4.22–5.81)
RDW: 12.9 % (ref 11.5–15.5)
WBC: 8 10*3/uL (ref 4.0–10.5)

## 2017-12-25 LAB — I-STAT TROPONIN, ED: Troponin i, poc: 0 ng/mL (ref 0.00–0.08)

## 2017-12-25 MED ORDER — IOPAMIDOL (ISOVUE-370) INJECTION 76%
INTRAVENOUS | Status: AC
  Administered 2017-12-25: 100 mL
  Filled 2017-12-25: qty 100

## 2017-12-25 NOTE — ED Triage Notes (Signed)
Pt c/o midsternal CP onset 2 weeks ago, tonight pain associated with breathing and eating.

## 2017-12-25 NOTE — ED Provider Notes (Signed)
[facility]   CSN: [id number] Arrival date & time: 12/25/17  0024     History   Chief Complaint Chief Complaint  Patient presents with  . Chest Pain    HPI Franklin Love is a 37 y.o. male.  Patient is a 37 year old male with no significant past medical history.  He presents for evaluation of chest pain.  This started approximately 5 days ago after lifting heavy objects at work.  His pain was initially to the right upper back.  He was seen here with this complaint, however no definite cause was found.  It was felt to be related to a muscular low skeletal etiology and he was discharged.  This evening he states he drank a sip of soda when he developed a sharp pain in his throat and chest.  Later, he ate a piece of steak and experienced a similar episode.  He describes these as if he was "swallowing a knife".  He denies any fevers, chills, or cough.  He has no cardiac risk factors and no prior cardiac history.  His symptoms are not exertional in nature.   The history is provided by the patient.  Chest Pain   This is a new problem. The current episode started more than 2 days ago. The problem occurs constantly (Intermittently). The problem has been gradually worsening. The pain is present in the substernal region. The pain is severe. The quality of the pain is described as sharp. The pain does not radiate. Pertinent negatives include no exertional chest pressure, no palpitations and no shortness of breath.    History reviewed. No pertinent past medical history.  There are no active problems to display for this patient.   History reviewed. No pertinent surgical history.     Home Medications    Prior to Admission medications   Medication Sig Start Date End Date Taking? Authorizing Provider  clotrimazole (LOTRIMIN) 1 % cream Apply to affected area 2 times daily 11/07/15   ChadWest, Emily, PA-C  clotrimazole (LOTRIMIN) 1 % cream Apply  to affected area 2 times daily 11/16/15   Cheri Fowlerose, Kayla, PA-C  ibuprofen (ADVIL,MOTRIN) 600 MG tablet Take 1 tablet (600 mg total) by mouth every 8 (eight) hours as needed for mild pain or moderate pain. 11/07/15   Trixie DredgeWest, Emily, PA-C  ibuprofen (ADVIL,MOTRIN) 800 MG tablet Take 1 tablet (800 mg total) by mouth 3 (three) times daily. 12/10/15   Joy, Shawn C, PA-C  meloxicam (MOBIC) 7.5 MG tablet Take 1 tablet (7.5 mg total) by mouth daily. 11/16/15   Cheri Fowlerose, Kayla, PA-C    Family History No family history on file.  Social History Social History   Tobacco Use  . Smoking status: Current Every Day Smoker  . Smokeless tobacco: Never Used  Substance Use Topics  . Alcohol use: Yes  . Drug use: No     Allergies   Patient has no known allergies.   Review of Systems Review of Systems  Respiratory: Negative for shortness of breath.   Cardiovascular: Positive for chest pain. Negative for palpitations.  All other systems reviewed and are negative.    Physical Exam Updated Vital Signs BP 122/80 (BP Location: Right Arm)   Pulse (!) 59   Temp 98.5 F (36.9 C) (Oral)   Resp 20   SpO2 99%   Physical Exam  Constitutional: He is oriented to person, place, and time. He appears well-developed and well-nourished. No distress.  HENT:  Head: Normocephalic and  atraumatic.  Mouth/Throat: Oropharynx is clear and moist.  Neck: Normal range of motion. Neck supple.  Cardiovascular: Normal rate and regular rhythm. Exam reveals no friction rub.  No murmur heard. Pulmonary/Chest: Effort normal and breath sounds normal. No respiratory distress. He has no wheezes. He has no rales.  Abdominal: Soft. Bowel sounds are normal. He exhibits no distension. There is no tenderness.  Musculoskeletal: Normal range of motion. He exhibits no edema.  Neurological: He is alert and oriented to person, place, and time. Coordination normal.  Skin: Skin is warm and dry. He is not diaphoretic.  Nursing note and vitals  reviewed.    ED Treatments / Results  Labs (all labs ordered are listed, but only abnormal results are displayed) Labs Reviewed  BASIC METABOLIC PANEL - Abnormal; Notable for the following components:      Result Value   Creatinine, Ser 1.25 (*)    All other components within normal limits  CBC - Abnormal; Notable for the following components:   Hemoglobin 12.5 (*)    All other components within normal limits  I-STAT TROPONIN, ED    EKG  EKG Interpretation  Date/Time:  Tuesday December 25 2017 00:29:29 EST Ventricular Rate:  66 PR Interval:  144 QRS Duration: 86 QT Interval:  384 QTC Calculation: 402 R Axis:   44 Text Interpretation:  Normal sinus rhythm with sinus arrhythmia Normal ECG Confirmed by Geoffery Lyons ([id number]) on 12/25/2017 4:12:14 AM       Radiology Dg Chest 2 View  Result Date: 12/25/2017 CLINICAL DATA:  Chest pain EXAM: CHEST  2 VIEW COMPARISON:  None. FINDINGS: The heart size and mediastinal contours are within normal limits. Both lungs are clear. The visualized skeletal structures are unremarkable. IMPRESSION: No active cardiopulmonary disease. Electronically Signed   By: Jasmine Pang M.D.   On: 12/25/2017 01:10    Procedures Procedures (including critical care time)  Medications Ordered in ED Medications - No data to display   Initial Impression / Assessment and Plan / ED Course  I have reviewed the triage vital signs and the nursing notes.  Pertinent labs & imaging results that were available during my care of the patient were reviewed by me and considered in my medical decision making (see chart for details).  CT scan of the patient's chest reveals no acute pathology.  His workup also reveals no evidence for a cardiac etiology.  His she EKG is unchanged and troponin is negative with 3 days worth of symptoms.  I strongly suspect a GI etiology.  If the symptoms persist, he is to follow-up with gastroenterology to discuss an endoscopy.  Final Clinical  Impressions(s) / ED Diagnoses   Final diagnoses:  None    ED Discharge Orders    None       Geoffery Lyons, MD 12/25/17 [id number]

## 2018-04-21 ENCOUNTER — Encounter (HOSPITAL_COMMUNITY): Payer: Self-pay | Admitting: Emergency Medicine

## 2018-04-21 ENCOUNTER — Emergency Department (HOSPITAL_COMMUNITY)
Admission: EM | Admit: 2018-04-21 | Discharge: 2018-04-21 | Disposition: A | Discharge status: Home / Self Care | Payer: Managed Care, Other (non HMO) | Attending: Emergency Medicine | Admitting: Emergency Medicine

## 2018-04-21 DIAGNOSIS — S29012A Strain of muscle and tendon of back wall of thorax, initial encounter: Secondary | ICD-10-CM

## 2018-04-21 DIAGNOSIS — X509XXA Other and unspecified overexertion or strenuous movements or postures, initial encounter: Secondary | ICD-10-CM | POA: Insufficient documentation

## 2018-04-21 DIAGNOSIS — Y9389 Activity, other specified: Secondary | ICD-10-CM | POA: Diagnosis not present

## 2018-04-21 DIAGNOSIS — Y929 Unspecified place or not applicable: Secondary | ICD-10-CM | POA: Diagnosis not present

## 2018-04-21 DIAGNOSIS — S299XXA Unspecified injury of thorax, initial encounter: Secondary | ICD-10-CM | POA: Diagnosis present

## 2018-04-21 DIAGNOSIS — F17200 Nicotine dependence, unspecified, uncomplicated: Secondary | ICD-10-CM | POA: Insufficient documentation

## 2018-04-21 DIAGNOSIS — Y999 Unspecified external cause status: Secondary | ICD-10-CM | POA: Insufficient documentation

## 2018-04-21 MED ORDER — CYCLOBENZAPRINE HCL 10 MG PO TABS: 10.0000 mg | ORAL_TABLET | Freq: Two times a day (BID) | ORAL | 0 refills | Status: DC | PRN

## 2018-04-21 MED ORDER — NAPROXEN 500 MG PO TABS
500.0000 mg | ORAL_TABLET | Freq: Two times a day (BID) | ORAL | 0 refills | Status: DC
Start: 2018-04-21 — End: 2020-05-28

## 2018-04-21 NOTE — ED Triage Notes (Signed)
Pt states he was working on his car and then tried to pick up his toddler and had pain to right upper back, pt states pain with movement like he pulled a muscle. Pt needs a note for job.

## 2018-04-21 NOTE — ED Provider Notes (Signed)
[facility]   CSN: [id number] Arrival date & time: 04/21/18  1725     History   Chief Complaint Chief Complaint  Patient presents with  . Back Pain    HPI Franklin Love is a 37 y.o. male who presents to ED for evaluation of right-sided back pain that began earlier today.  He states that he was working on his car trying to remove his tire when he started having the pain.  He then went home and tried to pick up his young son when he had worsening of his pain.  Reports history of similar symptoms in the past.  He has not taken any medicine help with his symptoms.  He is concerned that he may have "pulled a muscle or something."  He denies any prior back surgeries, history of cancer, history of IV drug use, injuries or falls, numbness in arms or legs, loss of bowel or bladder function, dysuria, chest pain.  HPI  History reviewed. No pertinent past medical history.  There are no active problems to display for this patient.   No past surgical history on file.      Home Medications    Prior to Admission medications   Medication Sig Start Date End Date Taking? Authorizing Provider  acetaminophen (TYLENOL) 500 MG tablet Take 1,000 mg by mouth every 6 (six) hours as needed for mild pain.    [provider]  cyclobenzaprine (FLEXERIL) 10 MG tablet Take 1 tablet (10 mg total) by mouth 2 (two) times daily as needed for muscle spasms. 04/21/18   Tashaun Obey, PA-C  ibuprofen (ADVIL,MOTRIN) 600 MG tablet Take 1 tablet (600 mg total) by mouth every 8 (eight) hours as needed for mild pain or moderate pain. 11/07/15   Trixie DredgeWest, Emily, PA-C  naproxen (NAPROSYN) 500 MG tablet Take 1 tablet (500 mg total) by mouth 2 (two) times daily. 04/21/18   Dietrich PatesKhatri, Adley Mazurowski, PA-C    Family History No family history on file.  Social History Social History   Tobacco Use  . Smoking status: Current Every Day Smoker  . Smokeless tobacco: Never Used    Substance Use Topics  . Alcohol use: Yes  . Drug use: No     Allergies   Patient has no known allergies.   Review of Systems Review of Systems  Constitutional: Negative for chills and fever.  Cardiovascular: Negative for chest pain.  Musculoskeletal: Positive for back pain and myalgias. Negative for arthralgias, gait problem, joint swelling, neck pain and neck stiffness.  Neurological: Negative for weakness and numbness.     Physical Exam Updated Vital Signs BP 128/73   Pulse 74   Temp 98.3 F (36.8 C)   Resp 18   Ht 5\' 7"  (1.702 m)   Wt 88.5 kg (195 lb)   SpO2 98%   BMI 30.54 kg/m   Physical Exam  Constitutional: He appears well-developed and well-nourished. No distress.  HENT:  Head: Normocephalic and atraumatic.  Eyes: Conjunctivae and EOM are normal. No scleral icterus.  Neck: Normal range of motion.  Cardiovascular: Normal rate, regular rhythm and normal heart sounds.  Pulmonary/Chest: Effort normal and breath sounds normal. No respiratory distress.  Musculoskeletal: Normal range of motion. He exhibits tenderness. He exhibits no edema or deformity.       Back:  No midline spinal tenderness present in lumbar, thoracic or cervical spine. No step-off palpated. No visible bruising, edema or temperature change noted. No objective signs of numbness present.  No saddle anesthesia. 2+ DP pulses bilaterally. Sensation intact to light touch. Strength 5/5 in bilateral lower extremities.  Neurological: He is alert.  Skin: No rash noted. He is not diaphoretic.  Psychiatric: He has a normal mood and affect.  Nursing note and vitals reviewed.    ED Treatments / Results  Labs (all labs ordered are listed, but only abnormal results are displayed) Labs Reviewed - No data to display  EKG None  Radiology No results found.  Procedures Procedures (including critical care time)  Medications Ordered in ED Medications - No data to display   Initial Impression /  Assessment and Plan / ED Course  I have reviewed the triage vital signs and the nursing notes.  Pertinent labs & imaging results that were available during my care of the patient were reviewed by me and considered in my medical decision making (see chart for details).     Patient denies any concerning symptoms suggestive of cauda equina requiring urgent imaging at this time such as loss of sensation in the lower extremities, lower extremity weakness, loss of bowel or bladder control, saddle anesthesia, urinary retention, fever/chills, IVDU. Exam demonstrated no  weakness on exam today. No preceding injury or trauma to suggest acute fracture. Doubt pelvic or urinary pathology for patient's acute back pain, as patient denies urinary symptoms. Doubt dissection as cause of patient's back pain as patient lacks major risk factors, and has symmetric and intact distal pulses.  We will treat as muscle strain with muscle relaxer and anti-inflammatories.  Patient given strict return precautions for any symptoms indicating worsening neurologic function in the lower extremities.  Portions of this note were generated with Scientist, clinical (histocompatibility and immunogenetics). Dictation errors may occur despite best attempts at proofreading.   Final Clinical Impressions(s) / ED Diagnoses   Final diagnoses:  Strain of mid-back, initial encounter    ED Discharge Orders        Ordered    cyclobenzaprine (FLEXERIL) 10 MG tablet  2 times daily PRN     04/21/18 1805    naproxen (NAPROSYN) 500 MG tablet  2 times daily     04/21/18 1805       Dietrich Pates, PA-C 04/21/18 1806    Bethann Berkshire, MD 04/21/18 2141

## 2018-04-21 NOTE — Discharge Instructions (Signed)
Return to the ED if you start to have worsening of your back pain, loss of your bladder function, numbness in your legs, fevers.

## 2018-05-21 ENCOUNTER — Other Ambulatory Visit: Payer: Self-pay

## 2018-05-21 ENCOUNTER — Emergency Department (HOSPITAL_COMMUNITY): Payer: Managed Care, Other (non HMO)

## 2018-05-21 ENCOUNTER — Encounter (HOSPITAL_COMMUNITY): Payer: Self-pay | Admitting: *Deleted

## 2018-05-21 ENCOUNTER — Emergency Department (HOSPITAL_COMMUNITY)
Admission: EM | Admit: 2018-05-21 | Discharge: 2018-05-21 | Disposition: A | Discharge status: Home / Self Care | Payer: Managed Care, Other (non HMO) | Attending: Emergency Medicine | Admitting: Emergency Medicine

## 2018-05-21 DIAGNOSIS — R05 Cough: Secondary | ICD-10-CM | POA: Diagnosis present

## 2018-05-21 DIAGNOSIS — J40 Bronchitis, not specified as acute or chronic: Secondary | ICD-10-CM

## 2018-05-21 DIAGNOSIS — J209 Acute bronchitis, unspecified: Secondary | ICD-10-CM | POA: Diagnosis not present

## 2018-05-21 DIAGNOSIS — F17210 Nicotine dependence, cigarettes, uncomplicated: Secondary | ICD-10-CM | POA: Diagnosis not present

## 2018-05-21 DIAGNOSIS — Z79899 Other long term (current) drug therapy: Secondary | ICD-10-CM | POA: Insufficient documentation

## 2018-05-21 MED ORDER — AZITHROMYCIN 250 MG PO TABS: 250.0000 mg | ORAL_TABLET | Freq: Every day | ORAL | 0 refills | Status: DC

## 2018-05-21 NOTE — ED Triage Notes (Signed)
Pt c/o productive cough with thick yellow sputum and sore throat, denies fever & chills, c/o sore throat, A&o x4

## 2018-05-21 NOTE — Discharge Instructions (Signed)
You were given a prescription for antibiotics. Please take the antibiotic prescription fully.   Please follow up with your primary care provider within 5-7 days for re-evaluation of your symptoms. If you do not have a primary care provider, information for a healthcare clinic has been provided for you to make arrangements for follow up care. Please return to the emergency department for any new or worsening symptoms.  

## 2018-05-21 NOTE — ED Notes (Signed)
Patient verbalizes understanding of discharge instructions. Opportunity for questioning and answers were provided. Armband removed by staff, pt discharged from ED ambulatory.   

## 2018-05-21 NOTE — ED Notes (Signed)
Patient transported to X-ray 

## 2018-05-21 NOTE — ED Notes (Signed)
Patient is now in the hallway bed resting

## 2018-05-21 NOTE — ED Provider Notes (Signed)
[facility] EMERGENCY DEPARTMENT Provider Note   CSN: [id number] Arrival date & time: 05/21/18  1238     History   Chief Complaint Chief Complaint  Patient presents with  . Cough    HPI Franklin Love is a 37 y.o. male.  HPI   Patient Is a 37 year old male with no significant past medical history who presents emergency department today for evaluation of productive cough with yellow/green sputum that has been present for the last 4 to 5 days.  Also reports associated chest wall pain when he coughs.  No persistent chest pain.  No shortness of breath.  Also is reports associated rhinorrhea, nasal congestion, sore throat.  No ear pain fevers.  Denies any other symptoms. Pt smokes tobacco.  History reviewed. No pertinent past medical history.  There are no active problems to display for this patient.   History reviewed. No pertinent surgical history.      Home Medications    Prior to Admission medications   Medication Sig Start Date End Date Taking? Authorizing Provider  acetaminophen (TYLENOL) 500 MG tablet Take 1,000 mg by mouth every 6 (six) hours as needed for mild pain.    [provider]  azithromycin (ZITHROMAX Z-PAK) 250 MG tablet Take 1 tablet (250 mg total) by mouth daily. Take 2 tablets on the first day of treatment. Then take 1 tablet per day for the next four days. 05/21/18   Anubis Fundora S, PA-C  cyclobenzaprine (FLEXERIL) 10 MG tablet Take 1 tablet (10 mg total) by mouth 2 (two) times daily as needed for muscle spasms. 04/21/18   Khatri, Hina, PA-C  ibuprofen (ADVIL,MOTRIN) 600 MG tablet Take 1 tablet (600 mg total) by mouth every 8 (eight) hours as needed for mild pain or moderate pain. 11/07/15   Trixie Dredge, PA-C  naproxen (NAPROSYN) 500 MG tablet Take 1 tablet (500 mg total) by mouth 2 (two) times daily. 04/21/18   Dietrich Pates, PA-C    Family History No family history on file.  Social History Social History   Tobacco Use  .  Smoking status: Current Every Day Smoker  . Smokeless tobacco: Never Used  Substance Use Topics  . Alcohol use: Yes  . Drug use: No     Allergies   Patient has no known allergies.   Review of Systems Review of Systems  Constitutional: Negative for fever.  HENT: Positive for congestion, rhinorrhea and sore throat. Negative for ear pain.   Respiratory: Positive for cough. Negative for shortness of breath and wheezing.   Cardiovascular: Negative for palpitations and leg swelling.       Chest wall pain with cough  Gastrointestinal: Negative for abdominal pain, constipation, diarrhea, nausea and vomiting.  Genitourinary: Negative for flank pain.  Musculoskeletal: Negative for back pain.  Neurological: Negative for headaches.    Physical Exam Updated Vital Signs BP 133/83 (BP Location: Left Arm)   Pulse 65   Temp 98.6 F (37 C) (Oral)   Resp 16   Ht 5\' 7"  (1.702 m)   Wt 86.6 kg (191 lb)   SpO2 100%   BMI 29.91 kg/m   Physical Exam  Constitutional: He appears well-developed and well-nourished.  Pt in no distress and is playing games on his phone during history and physical exam  HENT:  Head: Normocephalic and atraumatic.  Bilateral TMs without erythema or effusion.  Bilateral nasal turbinates are swollen.  No pharyngeal erythema,  tonsillar swelling or exudates.  Tolerating secretions.  Eyes: Pupils are  equal, round, and reactive to light. Conjunctivae and EOM are normal.  Neck: Neck supple.  Cardiovascular: Normal rate, regular rhythm and normal heart sounds.  No murmur heard. Pulmonary/Chest: Effort normal and breath sounds normal. No stridor. No respiratory distress. He has no wheezes. He has no rales.  Abdominal: Soft. Bowel sounds are normal. There is no tenderness.  Musculoskeletal: Normal range of motion.  Lymphadenopathy:    He has no cervical adenopathy.  Neurological: He is alert.  Skin: Skin is warm and dry.  Psychiatric: He has a normal mood and affect.    Nursing note and vitals reviewed.  ED Treatments / Results  Labs (all labs ordered are listed, but only abnormal results are displayed) Labs Reviewed - No data to display  EKG None  Radiology Dg Chest 2 View  Result Date: 05/21/2018 CLINICAL DATA:  37 year old with productive cough for 5 days. EXAM: CHEST - 2 VIEW COMPARISON:  CT 12/25/2017 and chest radiograph 12/25/2017 FINDINGS: The heart size and mediastinal contours are within normal limits. Both lungs are clear. The visualized skeletal structures are unremarkable. No pleural effusions. IMPRESSION: Normal chest examination. Electronically Signed   By: Richarda OverlieAdam  Henn M.D.   On: 05/21/2018 16:15    Procedures Procedures (including critical care time)  Medications Ordered in ED Medications - No data to display   Initial Impression / Assessment and Plan / ED Course  I have reviewed the triage vital signs and the nursing notes.  Pertinent labs & imaging results that were available during my care of the patient were reviewed by me and considered in my medical decision making (see chart for details).     Final Clinical Impressions(s) / ED Diagnoses   Final diagnoses:  Bronchitis   Pt CXR negative for acute infiltrate. Patients symptoms are consistent with URI.  Even that patient has a history of smoking we will treat him with azithromycin.  Advised him to follow-up with PCP and return for any new or worsening symptoms in the meantime.  Verbalizes understanding and is agreeable with plan. Pt is hemodynamically stable & in NAD prior to dc.  ED Discharge Orders        Ordered    azithromycin (ZITHROMAX Z-PAK) 250 MG tablet  Daily,   Status:  Discontinued     05/21/18 1647    azithromycin (ZITHROMAX Z-PAK) 250 MG tablet  Daily     05/21/18 [address]1648       Vena Bassinger S, PA-C 05/21/18 1649    Arby BarrettePfeiffer, Marcy, MD 05/27/18 1451

## 2018-05-21 NOTE — ED Notes (Signed)
ED Provider at bedside. 

## 2019-09-01 ENCOUNTER — Other Ambulatory Visit: Payer: Self-pay

## 2019-09-01 DIAGNOSIS — Z20822 Contact with and (suspected) exposure to covid-19: Secondary | ICD-10-CM

## 2019-09-02 LAB — NOVEL CORONAVIRUS, NAA: SARS-CoV-2, NAA: NOT DETECTED

## 2019-09-09 ENCOUNTER — Other Ambulatory Visit: Payer: Self-pay

## 2019-09-09 DIAGNOSIS — Z20822 Contact with and (suspected) exposure to covid-19: Secondary | ICD-10-CM

## 2019-09-11 LAB — NOVEL CORONAVIRUS, NAA: SARS-CoV-2, NAA: NOT DETECTED

## 2019-11-04 ENCOUNTER — Emergency Department (HOSPITAL_COMMUNITY)
Admission: EM | Admit: 2019-11-04 | Discharge: 2019-11-04 | Disposition: A | Discharge status: Home / Self Care | Payer: Managed Care, Other (non HMO) | Attending: Pediatric Emergency Medicine | Admitting: Pediatric Emergency Medicine

## 2019-11-04 ENCOUNTER — Encounter (HOSPITAL_COMMUNITY): Payer: Self-pay | Admitting: Emergency Medicine

## 2019-11-04 DIAGNOSIS — L03031 Cellulitis of right toe: Secondary | ICD-10-CM | POA: Insufficient documentation

## 2019-11-04 DIAGNOSIS — L089 Local infection of the skin and subcutaneous tissue, unspecified: Secondary | ICD-10-CM

## 2019-11-04 DIAGNOSIS — F17200 Nicotine dependence, unspecified, uncomplicated: Secondary | ICD-10-CM | POA: Insufficient documentation

## 2019-11-04 DIAGNOSIS — B351 Tinea unguium: Secondary | ICD-10-CM | POA: Insufficient documentation

## 2019-11-04 DIAGNOSIS — Z79899 Other long term (current) drug therapy: Secondary | ICD-10-CM | POA: Insufficient documentation

## 2019-11-04 MED ORDER — CEPHALEXIN 500 MG PO CAPS: 500.0000 mg | ORAL_CAPSULE | Freq: Two times a day (BID) | ORAL | 0 refills | Status: AC

## 2019-11-04 NOTE — ED Notes (Signed)
Sign out pad not used. Pt. Verbalized understanding of discharge instructions.  

## 2019-11-04 NOTE — ED Triage Notes (Addendum)
Pt states in the past year he had a right toe injury where his whole toe nail had came off and now feels like this right big toenail is becoming  ingrown.

## 2019-11-04 NOTE — ED Provider Notes (Signed)
[facility] EMERGENCY DEPARTMENT Provider Note   CSN: [id number] Arrival date & time: 11/04/19  1056     History Chief Complaint  Patient presents with  . Toe Pain    Franklin Love is a 39 y.o. male.  HPI     39yo M with history of toe nail fungus without history of treatment here after toe nail injury day prior.  Has lost toe nail in past.  Now with pain so presents.  Feel nail is inverted at this time.  No fevers.  Ambulates well.  No medications prior.   History reviewed. No pertinent past medical history.  There are no problems to display for this patient.   No past surgical history on file.     No family history on file.  Social History   Tobacco Use  . Smoking status: Current Every Day Smoker  . Smokeless tobacco: Never Used  Substance Use Topics  . Alcohol use: Yes  . Drug use: No    Home Medications Prior to Admission medications   Medication Sig Start Date End Date Taking? Authorizing Provider  acetaminophen (TYLENOL) 500 MG tablet Take 1,000 mg by mouth every 6 (six) hours as needed for mild pain.    [provider]  azithromycin (ZITHROMAX Z-PAK) 250 MG tablet Take 1 tablet (250 mg total) by mouth daily. Take 2 tablets on the first day of treatment. Then take 1 tablet per day for the next four days. 05/21/18   Couture, Cortni S, PA-C  cephALEXin (KEFLEX) 500 MG capsule Take 1 capsule (500 mg total) by mouth 2 (two) times daily for 7 days. 11/04/19 11/11/19  Aniceto Kyser, Wyvonnia Dusky, MD  cyclobenzaprine (FLEXERIL) 10 MG tablet Take 1 tablet (10 mg total) by mouth 2 (two) times daily as needed for muscle spasms. 04/21/18   Khatri, Hina, PA-C  ibuprofen (ADVIL,MOTRIN) 600 MG tablet Take 1 tablet (600 mg total) by mouth every 8 (eight) hours as needed for mild pain or moderate pain. 11/07/15   Trixie Dredge, PA-C  naproxen (NAPROSYN) 500 MG tablet Take 1 tablet (500 mg total) by mouth 2 (two) times daily. 04/21/18   Dietrich Pates, PA-C     Allergies    Patient has no known allergies.  Review of Systems   Review of Systems  Constitutional: Negative for activity change and fever.  HENT: Negative for congestion.   Respiratory: Negative for cough and shortness of breath.   Cardiovascular: Negative for chest pain.  Gastrointestinal: Negative for abdominal distention, diarrhea and vomiting.  Genitourinary: Negative for decreased urine volume and dysuria.  Musculoskeletal: Negative for back pain.  Skin: Positive for rash.       Toe nail changes  All other systems reviewed and are negative.   Physical Exam Updated Vital Signs BP (!) 147/80 (BP Location: Right Arm)   Pulse 69   Temp 98 F (36.7 C) (Oral)   Resp 16   SpO2 99%   Physical Exam Vitals and nursing note reviewed.  Constitutional:      Appearance: He is well-developed.  HENT:     Head: Normocephalic and atraumatic.     Nose: No congestion or rhinorrhea.  Eyes:     Conjunctiva/sclera: Conjunctivae normal.  Cardiovascular:     Rate and Rhythm: Normal rate and regular rhythm.     Heart sounds: No murmur.  Pulmonary:     Effort: Pulmonary effort is normal. No respiratory distress.     Breath sounds: Normal breath sounds.  Abdominal:  Palpations: Abdomen is soft.     Tenderness: There is no abdominal tenderness.  Musculoskeletal:     Cervical back: Neck supple.       Feet:  Feet:     Comments: Erythema surrounding R great toe with inversion of nail at distal end with thick white nail changes noted, white nail changes noted on small toe bilaterally as well as other great toe Skin:    General: Skin is warm and dry.     Capillary Refill: Capillary refill takes less than 2 seconds.  Neurological:     General: No focal deficit present.     Mental Status: He is alert and oriented to person, place, and time.     Cranial Nerves: No cranial nerve deficit.     Motor: No weakness.     Gait: Gait normal.     Deep Tendon Reflexes: Reflexes normal.      ED Results / Procedures / Treatments   Labs (all labs ordered are listed, but only abnormal results are displayed) Labs Reviewed - No data to display  EKG None  Radiology No results found.  Procedures Procedures (including critical care time)  Medications Ordered in ED Medications - No data to display  ED Course  I have reviewed the triage vital signs and the nursing notes.  Pertinent labs & imaging results that were available during my care of the patient were reviewed by me and considered in my medical decision making (see chart for details).    MDM Rules/Calculators/A&P                      Franklin Love is a 39 y.o. male with significant PMHx of onchomycosis who presented to ED with concerns for a skin infection.  Likely cellulitis.  Doubt erysipelas, impetigo, SSSS, TSS, SJS, nec fasc, abscess, hidradenitis suppurative, cat scratch.  At this time, patient does not have need for inpatient antibiotics (no signs of systemic infection, no DM, no immunocompromise, no failure of outpatient treatment). Will be treated with outpatient antibiotics (keflex).  Patient stable for discharge with PO antibiotics and appropriate f/u with Podiatry in 24-48 hours. Strict return precautions given.   Final Clinical Impression(s) / ED Diagnoses Final diagnoses:  Toe infection  Onychomycosis    Rx / DC Orders ED Discharge Orders         Ordered    cephALEXin (KEFLEX) 500 MG capsule  2 times daily     11/04/19 1247           Hula Tasso, Lillia Carmel, MD 11/04/19 1439

## 2019-11-21 ENCOUNTER — Ambulatory Visit: Payer: Self-pay | Attending: Internal Medicine

## 2019-11-21 DIAGNOSIS — Z20822 Contact with and (suspected) exposure to covid-19: Secondary | ICD-10-CM

## 2019-11-21 DIAGNOSIS — U071 COVID-19: Secondary | ICD-10-CM | POA: Insufficient documentation

## 2019-11-22 LAB — NOVEL CORONAVIRUS, NAA: SARS-CoV-2, NAA: DETECTED — AB

## 2019-12-02 ENCOUNTER — Ambulatory Visit: Payer: Self-pay | Attending: Internal Medicine

## 2019-12-02 DIAGNOSIS — Z20822 Contact with and (suspected) exposure to covid-19: Secondary | ICD-10-CM | POA: Insufficient documentation

## 2019-12-03 LAB — NOVEL CORONAVIRUS, NAA: SARS-CoV-2, NAA: NOT DETECTED

## 2020-03-09 ENCOUNTER — Other Ambulatory Visit: Payer: Self-pay

## 2020-03-09 ENCOUNTER — Emergency Department (HOSPITAL_COMMUNITY): Payer: Self-pay

## 2020-03-09 ENCOUNTER — Encounter (HOSPITAL_COMMUNITY): Payer: Self-pay | Admitting: Emergency Medicine

## 2020-03-09 ENCOUNTER — Emergency Department (HOSPITAL_COMMUNITY)
Admission: EM | Admit: 2020-03-09 | Discharge: 2020-03-09 | Disposition: A | Discharge status: Home / Self Care | Payer: Self-pay | Attending: Emergency Medicine | Admitting: Emergency Medicine

## 2020-03-09 DIAGNOSIS — Y939 Activity, unspecified: Secondary | ICD-10-CM | POA: Insufficient documentation

## 2020-03-09 DIAGNOSIS — Y999 Unspecified external cause status: Secondary | ICD-10-CM | POA: Insufficient documentation

## 2020-03-09 DIAGNOSIS — S86912A Strain of unspecified muscle(s) and tendon(s) at lower leg level, left leg, initial encounter: Secondary | ICD-10-CM | POA: Insufficient documentation

## 2020-03-09 DIAGNOSIS — Y929 Unspecified place or not applicable: Secondary | ICD-10-CM | POA: Insufficient documentation

## 2020-03-09 DIAGNOSIS — F17210 Nicotine dependence, cigarettes, uncomplicated: Secondary | ICD-10-CM | POA: Insufficient documentation

## 2020-03-09 DIAGNOSIS — X58XXXA Exposure to other specified factors, initial encounter: Secondary | ICD-10-CM | POA: Insufficient documentation

## 2020-03-09 NOTE — Progress Notes (Signed)
Orthopedic Tech Progress Note Patient Details:  Franklin Love 1980/11/21 [id number]  Ortho Devices Type of Ortho Device: Crutches Ortho Device/Splint Interventions: Ordered, Adjustment   Post Interventions Patient Tolerated: Well Instructions Provided: Care of device   Ruford Dudzinski A Ivi Griffith 03/09/2020, 2:58 PM

## 2020-03-09 NOTE — ED Notes (Signed)
Patient awake alert, color pink,chets clear,good aeration,no retractions 3plus pulses<2sec refill, walks with partial weight bearing, awaiting provider,denies injury

## 2020-03-09 NOTE — ED Triage Notes (Signed)
Pt reports left knee pain since Saturday. Denies any injury.

## 2020-03-09 NOTE — ED Provider Notes (Signed)
[facility] EMERGENCY DEPARTMENT Provider Note   CSN: [id number] Arrival date & time: 03/09/20  1320     History Chief Complaint  Patient presents with  . Knee Pain    Franklin Love is a 39 y.o. male.  Patient presents with left knee pain since Saturday.  Patient's been trying to work and it is getting worse standing on and lifting.  No direct injury or trauma.  Pain on the lateral aspect.  No history of knee surgeries or knee problems.  No fevers chills or vomiting.        History reviewed. No pertinent past medical history.  There are no problems to display for this patient.   History reviewed. No pertinent surgical history.     No family history on file.  Social History   Tobacco Use  . Smoking status: Current Every Day Smoker  . Smokeless tobacco: Never Used  Substance Use Topics  . Alcohol use: Yes  . Drug use: No    Home Medications Prior to Admission medications   Medication Sig Start Date End Date Taking? Authorizing Provider  acetaminophen (TYLENOL) 500 MG tablet Take 1,000 mg by mouth every 6 (six) hours as needed for mild pain.    [provider]  azithromycin (ZITHROMAX Z-PAK) 250 MG tablet Take 1 tablet (250 mg total) by mouth daily. Take 2 tablets on the first day of treatment. Then take 1 tablet per day for the next four days. 05/21/18   Couture, Cortni S, PA-C  cyclobenzaprine (FLEXERIL) 10 MG tablet Take 1 tablet (10 mg total) by mouth 2 (two) times daily as needed for muscle spasms. 04/21/18   Khatri, Hina, PA-C  ibuprofen (ADVIL,MOTRIN) 600 MG tablet Take 1 tablet (600 mg total) by mouth every 8 (eight) hours as needed for mild pain or moderate pain. 11/07/15   Trixie Dredge, PA-C  naproxen (NAPROSYN) 500 MG tablet Take 1 tablet (500 mg total) by mouth 2 (two) times daily. 04/21/18   Dietrich Pates, PA-C    Allergies    Patient has no known allergies.  Review of Systems   Review of Systems  Constitutional: Negative for  fever.  Musculoskeletal: Negative for joint swelling.  Neurological: Negative for weakness.    Physical Exam Updated Vital Signs BP (!) 139/91 (BP Location: Left Arm)   Pulse 65   Temp 97.7 F (36.5 C) (Oral)   Resp 15   SpO2 100%   Physical Exam Vitals and nursing note reviewed.  HENT:     Head: Normocephalic.  Cardiovascular:     Rate and Rhythm: Normal rate.  Pulmonary:     Effort: Pulmonary effort is normal.  Musculoskeletal:        General: Tenderness present. No swelling or deformity.     Comments: Patient has focal tenderness left lateral knee joint line lateral to patella, no significant effusion.  No warmth or erythema.  Full range of motion with flexion extension however mild discomfort.  No laxity appreciated.  Skin:    General: Skin is warm.  Neurological:     General: No focal deficit present.     Mental Status: He is alert.     ED Results / Procedures / Treatments   Labs (all labs ordered are listed, but only abnormal results are displayed) Labs Reviewed - No data to display  EKG None  Radiology DG Knee 2 Views Left  Result Date: 03/09/2020 CLINICAL DATA:  Acute left knee pain without known injury. EXAM: LEFT KNEE -  1-2 VIEW COMPARISON:  None. FINDINGS: No evidence of fracture, dislocation, or joint effusion. No evidence of arthropathy or other focal bone abnormality. Soft tissues are unremarkable. IMPRESSION: Negative. Electronically Signed   By: Marijo Conception M.D.   On: 03/09/2020 14:03    Procedures Procedures (including critical care time)  Medications Ordered in ED Medications - No data to display  ED Course  I have reviewed the triage vital signs and the nursing notes.  Pertinent labs & imaging results that were available during my care of the patient were reviewed by me and considered in my medical decision making (see chart for details).    MDM Rules/Calculators/A&P                      Patient presents with isolated left knee  strain.  Discussed concern for meniscal/ligamentous strain.  X-rays reviewed no acute fracture.  Plan for crutches, work note and follow-up outpatient.  Reasons to return discussed.  Discussed with Ortho technician for crutches training and crutches. Final Clinical Impression(s) / ED Diagnoses Final diagnoses:  Knee strain, left, initial encounter    Rx / DC Orders ED Discharge Orders    None       Elnora Morrison, MD 03/09/20 1447

## 2020-03-09 NOTE — Discharge Instructions (Signed)
Use ice, Tylenol and ibuprofen as needed for pain. Minimize weightbearing the next 2 days and gradually increase weightbearing as tolerated. Use crutches as needed.

## 2020-04-12 ENCOUNTER — Emergency Department (HOSPITAL_COMMUNITY)
Admission: EM | Admit: 2020-04-12 | Discharge: 2020-04-13 | Disposition: A | Discharge status: Home / Self Care | Payer: Self-pay | Attending: Emergency Medicine | Admitting: Emergency Medicine

## 2020-04-12 ENCOUNTER — Encounter (HOSPITAL_COMMUNITY): Payer: Self-pay | Admitting: Emergency Medicine

## 2020-04-12 ENCOUNTER — Other Ambulatory Visit: Payer: Self-pay

## 2020-04-12 DIAGNOSIS — M5412 Radiculopathy, cervical region: Secondary | ICD-10-CM | POA: Insufficient documentation

## 2020-04-12 DIAGNOSIS — F17200 Nicotine dependence, unspecified, uncomplicated: Secondary | ICD-10-CM | POA: Insufficient documentation

## 2020-04-12 DIAGNOSIS — R202 Paresthesia of skin: Secondary | ICD-10-CM

## 2020-04-12 LAB — BASIC METABOLIC PANEL
Anion gap: 11 (ref 5–15)
BUN: 20 mg/dL (ref 6–20)
CO2: 23 mmol/L (ref 22–32)
Calcium: 9.1 mg/dL (ref 8.9–10.3)
Chloride: 104 mmol/L (ref 98–111)
Creatinine, Ser: 1.37 mg/dL — ABNORMAL HIGH (ref 0.61–1.24)
GFR calc Af Amer: >60 mL/min (ref >60)
GFR calc non Af Amer: >60 mL/min (ref >60)
Glucose, Bld: 108 mg/dL — ABNORMAL HIGH (ref 70–99)
Potassium: 4.4 mmol/L (ref 3.5–5.1)
Sodium: 138 mmol/L (ref 135–145)

## 2020-04-12 LAB — CBC WITH DIFFERENTIAL/PLATELET
Abs Immature Granulocytes: 0.01 10*3/uL (ref 0.00–0.07)
Basophils Absolute: 0 10*3/uL (ref 0.0–0.1)
Basophils Relative: 0 %
Eosinophils Absolute: 0 10*3/uL (ref 0.0–0.5)
Eosinophils Relative: 1 %
HCT: 40.6 % (ref 39.0–52.0)
Hemoglobin: 12.9 g/dL — ABNORMAL LOW (ref 13.0–17.0)
Immature Granulocytes: 0 %
Lymphocytes Relative: 40 %
Lymphs Abs: 2.6 10*3/uL (ref 0.7–4.0)
MCH: 30.3 pg (ref 26.0–34.0)
MCHC: 31.8 g/dL (ref 30.0–36.0)
MCV: 95.3 fL (ref 80.0–100.0)
Monocytes Absolute: 0.6 10*3/uL (ref 0.1–1.0)
Monocytes Relative: 9 %
Neutro Abs: 3.4 10*3/uL (ref 1.7–7.7)
Neutrophils Relative %: 50 %
Platelets: 213 10*3/uL (ref 150–400)
RBC: 4.26 MIL/uL (ref 4.22–5.81)
RDW: 12.1 % (ref 11.5–15.5)
WBC: 6.7 10*3/uL (ref 4.0–10.5)
nRBC: 0 % (ref 0.0–0.2)

## 2020-04-12 NOTE — ED Triage Notes (Signed)
Patient reports intermittent numbness/tingling at right arm for 2 1/2 months , denies arm injury , equal strong grips with no arm drift . No chest pain/respirations unlabored.

## 2020-04-13 MED ORDER — PREDNISONE 20 MG PO TABS: 40.0000 mg | ORAL_TABLET | Freq: Every day | ORAL | 0 refills | Status: AC

## 2020-04-13 NOTE — ED Provider Notes (Signed)
[name] Provider Note   CSN: [id number] Arrival date & time: 04/12/20  2016     History Chief Complaint  Patient presents with  . Arm Tingling/Numbness for 2 1/2 months    Franklin Love is a 39 y.o. male who presents for evaluation of intermittent right upper extremity numbness/tingling that has been ongoing for the last 2.5 months.  He has not sought evaluation for the symptoms.  He states that he came into the emergency department today because he feels like symptoms are happening more frequently.  He states that this occurs intermittently and is not tied any particular action.  He states that when he does have the symptoms, he does feel like a pain in his right side of his cervical paraspinal area.  He denies any preceding trauma, injury.  He reports that most of the numbness/tingling is in the upper arm but states that occasionally will radiate down his arm.  He states he does not know if he has any weakness when this happens because he just tries not to lift or use the arm.  He states it last for about 15 minutes then goes away.  He stretches and massages it to help to get better.  He denies any fever, overlying warmth, erythema, edema, weakness, chest pain, difficulty breathing, abdominal pain, speech changes, vision changes.   The history is provided by the patient.       History reviewed. No pertinent past medical history.  There are no problems to display for this patient.   History reviewed. No pertinent surgical history.     No family history on file.  Social History   Tobacco Use  . Smoking status: Current Every Day Smoker  . Smokeless tobacco: Never Used  Substance Use Topics  . Alcohol use: Yes  . Drug use: No    Home Medications Prior to Admission medications   Medication Sig Start Date End Date Taking? Authorizing Provider  cyclobenzaprine (FLEXERIL) 10 MG tablet Take 1 tablet (10 mg total) by mouth 2 (two)  times daily as needed for muscle spasms. Patient not taking: Reported on 04/13/2020 04/21/18   Dietrich Pates, PA-C  ibuprofen (ADVIL,MOTRIN) 600 MG tablet Take 1 tablet (600 mg total) by mouth every 8 (eight) hours as needed for mild pain or moderate pain. Patient not taking: Reported on 04/13/2020 11/07/15   Trixie Dredge, PA-C  naproxen (NAPROSYN) 500 MG tablet Take 1 tablet (500 mg total) by mouth 2 (two) times daily. Patient not taking: Reported on 04/13/2020 04/21/18   Dietrich Pates, PA-C  predniSONE (DELTASONE) 20 MG tablet Take 2 tablets (40 mg total) by mouth daily for 4 days. 04/13/20 04/17/20  Maxwell Caul, PA-C    Allergies    Patient has no known allergies.  Review of Systems   Review of Systems  Constitutional: Negative for fever.  Respiratory: Negative for cough and shortness of breath.   Cardiovascular: Negative for chest pain.  Gastrointestinal: Negative for abdominal pain, nausea and vomiting.  Neurological: Positive for numbness. Negative for weakness and headaches.  All other systems reviewed and are negative.   Physical Exam Updated Vital Signs BP 124/87   Pulse (!) 59   Temp 97.7 F (36.5 C) (Oral)   Resp 11   Ht 5\' 6"  (1.676 m)   Wt 90 kg   SpO2 100%   BMI 32.02 kg/m   Physical Exam Vitals and nursing note reviewed.  Constitutional:      Appearance: Normal  appearance. He is well-developed.  HENT:     Head: Normocephalic and atraumatic.  Eyes:     General: Lids are normal.     Conjunctiva/sclera: Conjunctivae normal.     Pupils: Pupils are equal, round, and reactive to light.  Neck:     Comments: No midline C-spine tenderness.  Positive Spurling's maneuver that elicits symptoms.  Cardiovascular:     Rate and Rhythm: Normal rate and regular rhythm.     Pulses: Normal pulses.          Radial pulses are 2+ on the right side and 2+ on the left side.     Heart sounds: Normal heart sounds. No murmur heard.  No friction rub. No gallop.   Pulmonary:      Effort: Pulmonary effort is normal.     Breath sounds: Normal breath sounds.  Abdominal:     Palpations: Abdomen is soft. Abdomen is not rigid.     Tenderness: There is no abdominal tenderness. There is no guarding.  Musculoskeletal:        General: Normal range of motion.     Cervical back: Full passive range of motion without pain.     Comments: Bilateral upper extremities are symmetric in appearance without any overlying warmth, erythema, edema.  Skin:    General: Skin is warm and dry.     Capillary Refill: Capillary refill takes less than 2 seconds.     Comments: Good distal cap refill.  RUE is not dusky in appearance or cool to touch.  Neurological:     Mental Status: He is alert and oriented to person, place, and time.     Comments: Cranial nerves III-XII intact Follows commands, Moves all extremities  5/5 strength to BUE and BLE  Subjective sensation deficit noted on the right upper extremity to light touch.  He is able to fully distinguish between sharp and dull in all dermatome distributions of the right upper extremity.  Sensation otherwise intact throughout all major nerve distributions Normal shoulder shrug No gait abnormalities  No slurred speech. No facial droop.   Psychiatric:        Speech: Speech normal.     ED Results / Procedures / Treatments   Labs (all labs ordered are listed, but only abnormal results are displayed) Labs Reviewed  CBC WITH DIFFERENTIAL/PLATELET - Abnormal; Notable for the following components:      Result Value   Hemoglobin 12.9 (*)    All other components within normal limits  BASIC METABOLIC PANEL - Abnormal; Notable for the following components:   Glucose, Bld 108 (*)    Creatinine, Ser 1.37 (*)    All other components within normal limits    EKG None  Radiology No results found.  Procedures Procedures (including critical care time)  Medications Ordered in ED Medications - No data to display  ED Course  I have reviewed  the triage vital signs and the nursing notes.  Pertinent labs & imaging results that were available during my care of the patient were reviewed by me and considered in my medical decision making (see chart for details).    MDM Rules/Calculators/A&P                          39 year old male who presents for evaluation of intermittent right upper extremity numbness/tingling that has been ongoing for the last 2.5 months.  He feels like it is gotten more frequent which is what prompted his  emergency department visit.  On initially arrival, he is afebrile nontoxic-appearing.  Vital signs are stable.  He does report some subjective sensory deficit noted to the right upper extremity light touch but is able to distinguish between sharp and dull.  He also has positive Spurling's maneuver on exam.  I suspect this is most likely radiculopathy pain/numbness/tingling.  History/physical exam not concerning for ischemic limb, septic arthritis.  Additionally, history/physical exam is not concerning for CVA.  This is been ongoing for about 2.5 months.  It is intermittent in nature.  He has no weakness noted. Additionally, he has no other neurological deficits noted on exam.  Do not feel that he needs emergent MRI imaging at this time.   Labs ordered at triage.  CBC shows no leukocytosis.  Hemoglobin stable at 12.9.  BMP is unremarkable.  I discussed results with patient.  At this time, suspect that his symptoms are likely radiculopathy in nature.  He has no weakness noted here on exam.  He is able to differentiate between sharp and dull.  We will plan to give him outpatient referral to neurology for further evaluation.  We will put him on a short course of prednisone. At this time, patient exhibits no emergent life-threatening condition that require further evaluation in ED or admission. Patient had ample opportunity for questions and discussion. All patient's questions were answered with full understanding. Strict  return precautions discussed. Patient expresses understanding and agreement to plan.   Portions of this note were generated with Scientist, clinical (histocompatibility and immunogenetics). Dictation errors may occur despite best attempts at proofreading.  Final Clinical Impression(s) / ED Diagnoses Final diagnoses:  Cervical radiculopathy  Paresthesia    Rx / DC Orders ED Discharge Orders         Ordered    Ambulatory referral to Neurology     Discontinue  Reprint    Comments: An appointment is requested in approximately: 2 weeks   04/13/20 1056    predniSONE (DELTASONE) 20 MG tablet  Daily     Discontinue  Reprint     04/13/20 1056           Rosana Hoes 04/13/20 1119    Terald Sleeper, MD 04/13/20 1346

## 2020-04-13 NOTE — Discharge Instructions (Signed)
Take prednisone as directed.   Follow-up with [facility] to establish a primary care doctor if you do not have one.   Follow-up with referred neurologist.  I have put in a referral.  If you have not heard with them in a week or so, please call their office.  Return the emergency department for any redness or swelling of the arm, weakness of the arm or any other worsening or concerning symptoms.

## 2020-04-13 NOTE — ED Notes (Signed)
Called for VS x 2, no answer.  

## 2020-05-28 ENCOUNTER — Emergency Department (HOSPITAL_BASED_OUTPATIENT_CLINIC_OR_DEPARTMENT_OTHER): Payer: No Typology Code available for payment source

## 2020-05-28 ENCOUNTER — Encounter (HOSPITAL_BASED_OUTPATIENT_CLINIC_OR_DEPARTMENT_OTHER): Payer: Self-pay | Admitting: Emergency Medicine

## 2020-05-28 ENCOUNTER — Emergency Department (HOSPITAL_BASED_OUTPATIENT_CLINIC_OR_DEPARTMENT_OTHER)
Admission: EM | Admit: 2020-05-28 | Discharge: 2020-05-28 | Disposition: A | Discharge status: Home / Self Care | Payer: No Typology Code available for payment source | Attending: Emergency Medicine | Admitting: Emergency Medicine

## 2020-05-28 ENCOUNTER — Other Ambulatory Visit: Payer: Self-pay

## 2020-05-28 DIAGNOSIS — R202 Paresthesia of skin: Secondary | ICD-10-CM | POA: Diagnosis not present

## 2020-05-28 DIAGNOSIS — Y92410 Unspecified street and highway as the place of occurrence of the external cause: Secondary | ICD-10-CM | POA: Diagnosis not present

## 2020-05-28 DIAGNOSIS — Y999 Unspecified external cause status: Secondary | ICD-10-CM | POA: Diagnosis not present

## 2020-05-28 DIAGNOSIS — M545 Low back pain, unspecified: Secondary | ICD-10-CM

## 2020-05-28 DIAGNOSIS — Y939 Activity, unspecified: Secondary | ICD-10-CM | POA: Insufficient documentation

## 2020-05-28 DIAGNOSIS — F17210 Nicotine dependence, cigarettes, uncomplicated: Secondary | ICD-10-CM | POA: Insufficient documentation

## 2020-05-28 DIAGNOSIS — M542 Cervicalgia: Secondary | ICD-10-CM | POA: Diagnosis not present

## 2020-05-28 MED ORDER — LIDOCAINE 5 % EX PTCH: 1.0000 | MEDICATED_PATCH | CUTANEOUS | 0 refills | Status: DC

## 2020-05-28 MED ORDER — NAPROXEN 500 MG PO TABS
500.0000 mg | ORAL_TABLET | Freq: Two times a day (BID) | ORAL | 0 refills | Status: DC
Start: 2020-05-28 — End: 2021-01-12

## 2020-05-28 MED ORDER — METHOCARBAMOL 500 MG PO TABS
500.0000 mg | ORAL_TABLET | Freq: Two times a day (BID) | ORAL | 0 refills | Status: DC
Start: 2020-05-28 — End: 2021-01-12

## 2020-05-28 MED ORDER — HYDROCODONE-ACETAMINOPHEN 5-325 MG PO TABS
1.0000 | ORAL_TABLET | Freq: Once | ORAL | Status: AC
  Administered 2020-05-28: 1 via ORAL
  Filled 2020-05-28: qty 1

## 2020-05-28 MED FILL — METHOCARBAMOL 500 MG TABS: 500 | 10 days supply | Qty: 20 | Fill #0

## 2020-05-28 MED FILL — NAPROXEN 500 MG TABS: 500 | 15 days supply | Qty: 30 | Fill #0

## 2020-05-28 NOTE — ED Notes (Signed)
Patient transported to X-ray 
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
## 2020-05-28 NOTE — ED Notes (Signed)
ED Provider at bedside. 
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
## 2020-06-02 ENCOUNTER — Other Ambulatory Visit: Payer: Self-pay

## 2020-06-02 ENCOUNTER — Encounter (HOSPITAL_BASED_OUTPATIENT_CLINIC_OR_DEPARTMENT_OTHER): Payer: Self-pay | Admitting: Emergency Medicine

## 2020-06-02 ENCOUNTER — Emergency Department (HOSPITAL_BASED_OUTPATIENT_CLINIC_OR_DEPARTMENT_OTHER): Payer: No Typology Code available for payment source

## 2020-06-02 ENCOUNTER — Emergency Department (HOSPITAL_BASED_OUTPATIENT_CLINIC_OR_DEPARTMENT_OTHER)
Admission: EM | Admit: 2020-06-02 | Discharge: 2020-06-02 | Disposition: A | Discharge status: Home / Self Care | Payer: No Typology Code available for payment source | Attending: Emergency Medicine | Admitting: Emergency Medicine

## 2020-06-02 DIAGNOSIS — M5136 Other intervertebral disc degeneration, lumbar region: Secondary | ICD-10-CM

## 2020-06-02 DIAGNOSIS — Y939 Activity, unspecified: Secondary | ICD-10-CM | POA: Diagnosis not present

## 2020-06-02 DIAGNOSIS — M5126 Other intervertebral disc displacement, lumbar region: Secondary | ICD-10-CM | POA: Diagnosis not present

## 2020-06-02 DIAGNOSIS — Y999 Unspecified external cause status: Secondary | ICD-10-CM | POA: Insufficient documentation

## 2020-06-02 DIAGNOSIS — Y929 Unspecified place or not applicable: Secondary | ICD-10-CM | POA: Insufficient documentation

## 2020-06-02 DIAGNOSIS — M5416 Radiculopathy, lumbar region: Secondary | ICD-10-CM | POA: Diagnosis not present

## 2020-06-02 DIAGNOSIS — M545 Low back pain: Secondary | ICD-10-CM | POA: Diagnosis present

## 2020-06-02 MED ORDER — PREDNISONE 10 MG (21) PO TBPK
ORAL_TABLET | ORAL | 0 refills | Status: DC
Start: 2020-06-02 — End: 2020-06-10

## 2020-06-02 MED ORDER — DEXAMETHASONE SODIUM PHOSPHATE 10 MG/ML IJ SOLN
10.0000 mg | Freq: Once | INTRAMUSCULAR | Status: AC
  Administered 2020-06-02: 10 mg via INTRAMUSCULAR
  Filled 2020-06-02: qty 1

## 2020-06-02 MED ORDER — KETOROLAC TROMETHAMINE 30 MG/ML IJ SOLN
30.0000 mg | Freq: Once | INTRAMUSCULAR | Status: AC
  Administered 2020-06-02: 30 mg via INTRAMUSCULAR
  Filled 2020-06-02: qty 1

## 2020-06-02 MED FILL — predniSONE 10 MG TABS: 10 | 12 days supply | Qty: 42 | Fill #0
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
## 2020-06-10 ENCOUNTER — Other Ambulatory Visit: Payer: Self-pay

## 2020-06-10 ENCOUNTER — Encounter (HOSPITAL_BASED_OUTPATIENT_CLINIC_OR_DEPARTMENT_OTHER): Payer: Self-pay | Admitting: Emergency Medicine

## 2020-06-10 ENCOUNTER — Emergency Department (HOSPITAL_BASED_OUTPATIENT_CLINIC_OR_DEPARTMENT_OTHER)
Admission: EM | Admit: 2020-06-10 | Discharge: 2020-06-10 | Disposition: A | Discharge status: Home / Self Care | Payer: No Typology Code available for payment source | Attending: Emergency Medicine | Admitting: Emergency Medicine

## 2020-06-10 DIAGNOSIS — M5430 Sciatica, unspecified side: Secondary | ICD-10-CM | POA: Diagnosis not present

## 2020-06-10 DIAGNOSIS — F17210 Nicotine dependence, cigarettes, uncomplicated: Secondary | ICD-10-CM | POA: Diagnosis not present

## 2020-06-10 DIAGNOSIS — M5440 Lumbago with sciatica, unspecified side: Secondary | ICD-10-CM

## 2020-06-10 DIAGNOSIS — M545 Low back pain: Secondary | ICD-10-CM | POA: Diagnosis present

## 2020-06-10 MED ORDER — HYDROCODONE-ACETAMINOPHEN 5-325 MG PO TABS: 1.0000 | ORAL_TABLET | ORAL | 0 refills | Status: DC | PRN

## 2020-06-10 MED ORDER — PREDNISONE 50 MG PO TABS
60.0000 mg | ORAL_TABLET | Freq: Once | ORAL | Status: AC
  Administered 2020-06-10: 60 mg via ORAL
  Filled 2020-06-10: qty 1

## 2020-06-10 MED ORDER — KETOROLAC TROMETHAMINE 30 MG/ML IJ SOLN
30.0000 mg | Freq: Once | INTRAMUSCULAR | Status: AC
  Administered 2020-06-10: 30 mg via INTRAMUSCULAR
  Filled 2020-06-10: qty 1

## 2020-06-10 MED ORDER — PREDNISONE 50 MG PO TABS: 50.0000 mg | ORAL_TABLET | Freq: Every day | ORAL | 0 refills | Status: AC

## 2020-06-10 MED ORDER — LIDOCAINE 5 % EX PTCH: 1.0000 | MEDICATED_PATCH | CUTANEOUS | 0 refills | Status: DC
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
## 2020-06-10 NOTE — Discharge Instructions (Signed)
Follow up with Neurosurgery for reevaluation
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
## 2020-06-11 ENCOUNTER — Ambulatory Visit (HOSPITAL_COMMUNITY): Admission: RE | Admit: 2020-06-11 | Payer: Self-pay | Source: Ambulatory Visit

## 2020-06-11 ENCOUNTER — Ambulatory Visit (HOSPITAL_COMMUNITY)
Admission: RE | Admit: 2020-06-11 | Discharge: 2020-06-11 | Disposition: A | Discharge status: Home / Self Care | Payer: No Typology Code available for payment source | Source: Ambulatory Visit | Attending: Physician Assistant | Admitting: Physician Assistant

## 2020-06-11 DIAGNOSIS — M79661 Pain in right lower leg: Secondary | ICD-10-CM | POA: Diagnosis not present

## 2020-06-11 DIAGNOSIS — M79662 Pain in left lower leg: Secondary | ICD-10-CM | POA: Diagnosis present

## 2020-06-11 DIAGNOSIS — Z87828 Personal history of other (healed) physical injury and trauma: Secondary | ICD-10-CM | POA: Diagnosis not present

## 2020-06-11 DIAGNOSIS — R52 Pain, unspecified: Secondary | ICD-10-CM

## 2020-06-11 DIAGNOSIS — M7989 Other specified soft tissue disorders: Secondary | ICD-10-CM

## 2020-06-11 DIAGNOSIS — R609 Edema, unspecified: Secondary | ICD-10-CM

## 2020-06-21 ENCOUNTER — Ambulatory Visit: Payer: Self-pay | Admitting: Diagnostic Neuroimaging

## 2020-06-22 ENCOUNTER — Ambulatory Visit: Payer: Self-pay | Admitting: Family Medicine

## 2020-06-22 NOTE — Progress Notes (Deleted)
  Franklin Love - 39 y.o. male MRN [id number]  Date of birth: 11/26/1980  SUBJECTIVE:  Including CC & ROS.  No chief complaint on file.   Franklin Love is a 39 y.o. male that is  ***.  ***   Review of Systems See HPI   HISTORY: Past Medical, Surgical, Social, and Family History Reviewed & Updated per EMR.   Pertinent Historical Findings include:  No past medical history on file.  No past surgical history on file.  No family history on file.  Social History   Socioeconomic History  . Marital status: Single    Spouse name: Not on file  . Number of children: Not on file  . Years of education: Not on file  . Highest education level: Not on file  Occupational History  . Not on file  Tobacco Use  . Smoking status: Current Every Day Smoker    Packs/day: 0.50    Types: Cigarettes  . Smokeless tobacco: Never Used  Substance and Sexual Activity  . Alcohol use: Yes    Comment: "2 or 3 shots every couple days":  . Drug use: No  . Sexual activity: Not on file  Other Topics Concern  . Not on file  Social History Narrative  . Not on file   Social Determinants of Health   Financial Resource Strain:   . Difficulty of Paying Living Expenses: Not on file  Food Insecurity:   . Worried About Programme researcher, broadcasting/film/video in the Last Year: Not on file  . Ran Out of Food in the Last Year: Not on file  Transportation Needs:   . Lack of Transportation (Medical): Not on file  . Lack of Transportation (Non-Medical): Not on file  Physical Activity:   . Days of Exercise per Week: Not on file  . Minutes of Exercise per Session: Not on file  Stress:   . Feeling of Stress : Not on file  Social Connections:   . Frequency of Communication with Friends and Family: Not on file  . Frequency of Social Gatherings with Friends and Family: Not on file  . Attends Religious Services: Not on file  . Active Member of Clubs or Organizations: Not on file  . Attends Banker Meetings: Not on  file  . Marital Status: Not on file  Intimate Partner Violence:   . Fear of Current or Ex-Partner: Not on file  . Emotionally Abused: Not on file  . Physically Abused: Not on file  . Sexually Abused: Not on file     PHYSICAL EXAM:  VS: There were no vitals taken for this visit. Physical Exam Gen: NAD, alert, cooperative with exam, well-appearing MSK:  ***      ASSESSMENT & PLAN:   No problem-specific Assessment & Plan notes found for this encounter.

## 2020-06-23 ENCOUNTER — Other Ambulatory Visit: Payer: Self-pay

## 2020-06-23 ENCOUNTER — Encounter: Payer: Self-pay | Admitting: Family Medicine

## 2020-06-23 ENCOUNTER — Ambulatory Visit (INDEPENDENT_AMBULATORY_CARE_PROVIDER_SITE_OTHER): Payer: Self-pay | Admitting: Family Medicine

## 2020-06-23 VITALS — BP 130/83 | HR 67 | Ht 67.0 in | Wt 200.0 lb

## 2020-06-23 DIAGNOSIS — M545 Low back pain, unspecified: Secondary | ICD-10-CM

## 2020-06-23 DIAGNOSIS — M542 Cervicalgia: Secondary | ICD-10-CM | POA: Insufficient documentation

## 2020-06-23 MED ORDER — IBUPROFEN-FAMOTIDINE 800-26.6 MG PO TABS: 1.0000 | ORAL_TABLET | Freq: Three times a day (TID) | ORAL | 3 refills | Status: DC
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
## 2020-06-23 NOTE — Patient Instructions (Signed)
Nice to meet you  Please try heat  Please try the exercises  Please try the duexis  Physical therapy will give you a call  Please send me a message in MyChart with any questions or updates.  Please see me back in 3-4 weeks.   --Dr. Jordan Likes
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
## 2020-06-23 NOTE — Progress Notes (Signed)
Medication Samples have been provided to the patient.  Drug name: Duexis       Strength: 800mg /26.6mg         Qty: 2 boxes  LOT:  Exp.Date: 01/2021  Dosing instructions: take 1 tablet by mouth three (3) times a day.  The patient has been instructed regarding the correct time, dose, and frequency of taking this medication, including desired effects and most common side effects.   02/2021, Kathi Simpers 12:07 PM 06/23/2020

## 2020-06-23 NOTE — Assessment & Plan Note (Signed)
Pain seems myofascial in nature.  No fracture or abnormality appreciated on CT scan. -Counseled on home exercise therapy and supportive care. -Duexis and sample provided. -Referral to physical therapy. -Could consider trigger point injections or MRI.

## 2020-06-30 ENCOUNTER — Other Ambulatory Visit: Payer: Self-pay

## 2020-06-30 ENCOUNTER — Ambulatory Visit: Payer: No Typology Code available for payment source | Attending: Family Medicine | Admitting: Physical Therapy

## 2020-06-30 ENCOUNTER — Encounter: Payer: Self-pay | Admitting: Physical Therapy

## 2020-06-30 DIAGNOSIS — M25562 Pain in left knee: Secondary | ICD-10-CM | POA: Diagnosis present

## 2020-06-30 DIAGNOSIS — M545 Low back pain, unspecified: Secondary | ICD-10-CM

## 2020-06-30 DIAGNOSIS — R293 Abnormal posture: Secondary | ICD-10-CM | POA: Insufficient documentation

## 2020-06-30 DIAGNOSIS — M542 Cervicalgia: Secondary | ICD-10-CM | POA: Diagnosis present
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
## 2020-07-14 ENCOUNTER — Telehealth: Payer: Self-pay | Admitting: Physical Therapy

## 2020-07-14 ENCOUNTER — Ambulatory Visit: Payer: No Typology Code available for payment source | Admitting: Physical Therapy

## 2020-07-14 NOTE — Telephone Encounter (Signed)
Attempted to call pt regarding missed appointment today. Someone answered the phone, and when I introduced myself they placed me on hold and never returned.  Teancum Brule PT, DPT, LAT, ATC  07/14/20  5:02 PM

## 2020-07-16 ENCOUNTER — Ambulatory Visit: Payer: No Typology Code available for payment source | Admitting: Physical Therapy

## 2020-07-16 ENCOUNTER — Telehealth: Payer: Self-pay | Admitting: Physical Therapy

## 2020-07-16 NOTE — Telephone Encounter (Signed)
Spoke to patient regarding no-show x 2 for physical therapy appointments. He reports that he is receiving PT and Chiropractor care at another facility. He requests to discontinue PT at our clinic. He will call to schedule if needed.

## 2020-07-20 ENCOUNTER — Ambulatory Visit: Payer: No Typology Code available for payment source | Admitting: Physical Therapy

## 2020-07-22 ENCOUNTER — Ambulatory Visit: Payer: No Typology Code available for payment source | Admitting: Physical Therapy

## 2020-07-27 ENCOUNTER — Encounter: Payer: Self-pay | Admitting: Physical Therapy

## 2020-07-29 ENCOUNTER — Encounter: Payer: Self-pay | Admitting: Physical Therapy

## 2020-08-03 ENCOUNTER — Encounter: Payer: Self-pay | Admitting: Physical Therapy

## 2020-08-05 ENCOUNTER — Encounter: Payer: Self-pay | Admitting: Physical Therapy

## 2021-01-12 ENCOUNTER — Emergency Department (HOSPITAL_COMMUNITY): Payer: Self-pay

## 2021-01-12 ENCOUNTER — Other Ambulatory Visit: Payer: Self-pay

## 2021-01-12 ENCOUNTER — Emergency Department (HOSPITAL_COMMUNITY)
Admission: EM | Admit: 2021-01-12 | Discharge: 2021-01-12 | Disposition: A | Discharge status: Home / Self Care | Payer: Self-pay | Attending: Emergency Medicine | Admitting: Emergency Medicine

## 2021-01-12 DIAGNOSIS — M25571 Pain in right ankle and joints of right foot: Secondary | ICD-10-CM | POA: Insufficient documentation

## 2021-01-12 DIAGNOSIS — F17210 Nicotine dependence, cigarettes, uncomplicated: Secondary | ICD-10-CM | POA: Insufficient documentation

## 2021-01-12 MED ORDER — NAPROXEN 500 MG PO TABS
500.0000 mg | ORAL_TABLET | Freq: Two times a day (BID) | ORAL | 0 refills | Status: DC
Start: 2021-01-12 — End: 2021-07-25

## 2021-01-12 NOTE — ED Provider Notes (Signed)
[facility] EMERGENCY DEPARTMENT Provider Note   CSN: [id number] Arrival date & time: 01/12/21  1932   History Chief Complaint  Patient presents with  . Foot Pain    Franklin Love is a 40 y.o. male.  The history is provided by the patient.  Foot Pain  He woke up this morning with pain in the medial aspect of his right foot near the ankle.  He denies any trauma or overuse.  Pain is sharp.  He rates it at 5/10 at rest, 10/10 if he tries to put any weight on it.  No past medical history on file.  Patient Active Problem List   Diagnosis Date Noted  . Neck pain 06/23/2020  . Acute bilateral low back pain without sciatica 06/23/2020    No past surgical history on file.    No family history on file.  Social History   Tobacco Use  . Smoking status: Current Every Day Smoker    Packs/day: 0.50    Types: Cigarettes  . Smokeless tobacco: Never Used  Substance Use Topics  . Alcohol use: Yes    Comment: "2 or 3 shots every couple days":  . Drug use: No    Home Medications Prior to Admission medications   Medication Sig Start Date End Date Taking? Authorizing Provider  HYDROcodone-acetaminophen (NORCO/VICODIN) 5-325 MG tablet Take 1 tablet by mouth every 4 (four) hours as needed. 06/10/20   Henderly, Britni A, PA-C  Ibuprofen-Famotidine 800-26.6 MG TABS Take 1 tablet by mouth 3 (three) times daily. 06/23/20   Myra Rude, MD  lidocaine (LIDODERM) 5 % Place 1 patch onto the skin daily. Remove & Discard patch within 12 hours or as directed by MD 06/10/20   Henderly, Britni A, PA-C  methocarbamol (ROBAXIN) 500 MG tablet Take 1 tablet (500 mg total) by mouth 2 (two) times daily. 05/28/20   Henderly, Britni A, PA-C  naproxen (NAPROSYN) 500 MG tablet Take 1 tablet (500 mg total) by mouth 2 (two) times daily. 05/28/20   Henderly, Britni A, PA-C    Allergies    Patient has no known allergies.  Review of Systems   Review of Systems  All other systems reviewed and  are negative.   Physical Exam Updated Vital Signs BP 139/83 (BP Location: Left Arm)   Pulse 64   Temp 99 F (37.2 C) (Oral)   Resp 16   SpO2 98%   Physical Exam Vitals and nursing note reviewed.   40 year old male, resting comfortably and in no acute distress. Vital signs are normal. Oxygen saturation is 98%, which is normal. Head is normocephalic and atraumatic. PERRLA, EOMI. Oropharynx is clear. Neck is nontender and supple without adenopathy or JVD. Back is nontender and there is no CVA tenderness. Lungs are clear without rales, wheezes, or rhonchi. Chest is nontender. Heart has regular rate and rhythm without murmur. Abdomen is soft, flat, nontender without masses or hepatosplenomegaly and peristalsis is normoactive. Extremities: There is no obvious swelling or deformity to the right foot or ankle.  There is tenderness to palpation just anterior and inferior to the medial malleolus.  This seems to be centered over a tendon.  Pain is worsened with stress is applied to that tendon.  No other areas of tenderness elicited.  Remainder of extremity exam is normal. Skin is warm and dry without rash. Neurologic: Mental status is normal, cranial nerves are intact, there are no motor or sensory deficits.  ED Results / Procedures / Treatments  Radiology DG Foot Complete Right  Result Date: 01/12/2021 CLINICAL DATA:  Medial foot pain EXAM: RIGHT FOOT COMPLETE - 3+ VIEW COMPARISON:  12/10/2015 FINDINGS: Normal alignment no fracture. Mild degenerative change in the first MTP and in the midfoot with dorsal spurring in the midfoot. Mild soft tissue calcification at the Achilles tendon insertion. IMPRESSION: Mild degenerative change in the first MTP and midfoot. No acute abnormality Electronically Signed   By: Marlan Palau M.D.   On: 01/12/2021 20:11    Procedures Procedures   Medications Ordered in ED Medications - No data to display  ED Course  I have reviewed the triage vital  signs and the nursing notes.  Pertinent imaging results that were available during my care of the patient were reviewed by me and considered in my medical decision making (see chart for details).  MDM Rules/Calculators/A&P Right foot pain which appears to be tendinitis.  Cause of tendinitis is not clear.  No joint swelling, erythema, warmth to suggest gout.  X-rays were obtained which showed no evidence of fracture.  He is placed in a postop shoe and advised to use crutches as needed.  Advised on ice and elevation.  He is given prescription for naproxen and is referred to orthopedics for follow-up.  Old records are reviewed, and he has no relevant past visits.`  Final Clinical Impression(s) / ED Diagnoses Final diagnoses:  Acute right ankle pain    Rx / DC Orders ED Discharge Orders         Ordered    naproxen (NAPROSYN) 500 MG tablet  2 times daily        01/12/21 2313           Dione Booze, MD 01/12/21 2326

## 2021-01-12 NOTE — ED Notes (Signed)
Called pts name 3X no answer

## 2021-01-12 NOTE — ED Notes (Signed)
Pt has now returned to lobby, has been outside

## 2021-01-12 NOTE — Discharge Instructions (Addendum)
Apply ice for 30 minutes at a time, 4 times a day.  Take naproxen as prescribed.  If you need additional pain relief, you may take acetaminophen in addition to the naproxen.  Wear the postop shoe and use crutches as needed.  If your pain does not seem to be improving, follow-up with the orthopedic physician.

## 2021-01-12 NOTE — ED Notes (Signed)
Pt verbalized understanding of d/c instructions, follow up and medications. Post op shoe applied prior to d/c, pt using his personal crutches

## 2021-01-12 NOTE — ED Triage Notes (Signed)
Pt woke up in the middle of the night with R foot pain. Mild swelling noted, tender on palpation, difficulty ambulating

## 2021-07-25 ENCOUNTER — Emergency Department (HOSPITAL_COMMUNITY): Payer: Self-pay

## 2021-07-25 ENCOUNTER — Emergency Department (HOSPITAL_COMMUNITY)
Admission: EM | Admit: 2021-07-25 | Discharge: 2021-07-25 | Disposition: A | Discharge status: Home / Self Care | Payer: Self-pay | Attending: Emergency Medicine | Admitting: Emergency Medicine

## 2021-07-25 ENCOUNTER — Encounter (HOSPITAL_COMMUNITY): Payer: Self-pay | Admitting: Emergency Medicine

## 2021-07-25 DIAGNOSIS — F17210 Nicotine dependence, cigarettes, uncomplicated: Secondary | ICD-10-CM | POA: Insufficient documentation

## 2021-07-25 DIAGNOSIS — M25532 Pain in left wrist: Secondary | ICD-10-CM | POA: Insufficient documentation

## 2021-07-25 MED ORDER — NAPROXEN 500 MG PO TABS
500.0000 mg | ORAL_TABLET | Freq: Two times a day (BID) | ORAL | 0 refills | Status: DC
Start: 2021-07-25 — End: 2021-07-25

## 2021-07-25 MED ORDER — NAPROXEN 500 MG PO TABS
500.0000 mg | ORAL_TABLET | Freq: Two times a day (BID) | ORAL | 0 refills | Status: DC
Start: 2021-07-25 — End: 2021-11-21

## 2021-07-25 NOTE — ED Provider Notes (Signed)
Emergency Medicine Provider Triage Evaluation Note  Franklin Love , a 40 y.o. male  was evaluated in triage.  Pt complains of left hand pain.  States that he has been bothering him since this morning.  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  States that movement causes shooting pain up his arm.  Review of Systems  Positive: Left wrist pain Negative: Fevers, chills, nausea, vomiting  Physical Exam  BP 122/82 (BP Location: Right Arm)   Pulse 85   Temp 98.7 F (37.1 C)   Resp 17   SpO2 98%  Gen:   Awake, no distress   Resp:  Normal effort  MSK:   Decreased ROM flexion and extension of left wrist. Other:  Distal pulses intact, sensation intact in affected extremity.  No erythema, edema, or warmth in affected joint.  No snuffbox tenderness.  Medical Decision Making  Medically screening exam initiated at 10:02 AM.  Appropriate orders placed.  Franklin Love was informed that the remainder of the evaluation will be completed by another provider, this initial triage assessment does not replace that evaluation, and the importance of remaining in the ED until their evaluation is complete.     Vear Clock 07/25/21 1006    Bethann Berkshire, MD 07/30/21 1150

## 2021-07-25 NOTE — Discharge Instructions (Signed)
You were seen and evaluated in the emergency department for further evaluation of left wrist pain.  As we discussed, the images did not reveal any fracture or dislocation at this time.  This is likely muscular in nature.  Please ice the area for the next 72 hours and then use heat.  I have given you a prescription for naproxen for pain.  Please return to the emergency department if you experience worsening pain, swelling, new and severe numbness, weakness of the hand, change in temperature of the hand, or any other concerns by.  I have given you follow-up with [facility] which is her primary care service for further evaluation.

## 2021-07-25 NOTE — ED Triage Notes (Signed)
Patient here for evaluation of left wrist pain and numbness that was present when he woke up this morning. Pain is exacerbated by closing grip. Denies known history of carpal tunnel.

## 2021-07-25 NOTE — ED Notes (Signed)
Pt stepped outside.  

## 2021-07-25 NOTE — ED Provider Notes (Signed)
[facility] EMERGENCY DEPARTMENT Provider Note   CSN: [id number] Arrival date & time: 07/25/21  9371     History Chief Complaint  Patient presents with   Hand Pain    Franklin Love is a 40 y.o. male who presents to the emergency department for further evaluation of constant left anterior wrist pain that began upon waking up this morning.  He rates his wrist pain moderate in severity and it is worse with movement.  He does report associated decreased grip strength secondary to pain.  He has not taken anything for the pain.  He denies any numbness to the hand.  He describes his hand pain and wrist pain as sharp.  He denies any trauma or injury to the area.  No fevers, chills, sore throat, cough, congestion, ear pain, or sinus drainage.   The history is provided by the patient. No language interpreter was used.  Hand Pain      History reviewed. No pertinent past medical history.  Patient Active Problem List   Diagnosis Date Noted   Neck pain 06/23/2020   Acute bilateral low back pain without sciatica 06/23/2020    No past surgical history on file.     No family history on file.  Social History   Tobacco Use   Smoking status: Every Day    Packs/day: 0.50    Types: Cigarettes   Smokeless tobacco: Never  Substance Use Topics   Alcohol use: Yes    Comment: "2 or 3 shots every couple days":   Drug use: No    Home Medications Prior to Admission medications   Medication Sig Start Date End Date Taking? Authorizing Provider  naproxen (NAPROSYN) 500 MG tablet Take 1 tablet (500 mg total) by mouth 2 (two) times daily. 07/25/21  Yes Honor Loh M, PA-C    Allergies    Patient has no known allergies.  Review of Systems   Review of Systems  All other systems reviewed and are negative.  Physical Exam Updated Vital Signs BP 122/82 (BP Location: Right Arm)   Pulse 85   Temp 98.7 F (37.1 C)   Resp 17   SpO2 98%   Physical Exam Vitals reviewed.   Constitutional:      Appearance: Normal appearance.  HENT:     Head: Normocephalic and atraumatic.  Eyes:     General:        Right eye: No discharge.        Left eye: No discharge.     Conjunctiva/sclera: Conjunctivae normal.  Pulmonary:     Effort: Pulmonary effort is normal.  Musculoskeletal:     Comments: Minimal swelling over the left anterior wrist.  Area is tender to palpation.  He does have decreased grip strength in the left hand secondary to pain.  He does have limited range of motion at the wrist secondary to pain.  Normal range of motion of the fingers, and, elbow, and shoulder.  He is neurovascularly intact distal to the wrist.  Skin:    General: Skin is warm and dry.     Findings: No rash.  Neurological:     General: No focal deficit present.     Mental Status: He is alert.  Psychiatric:        Mood and Affect: Mood normal.        Behavior: Behavior normal.    ED Results / Procedures / Treatments   Labs (all labs ordered are listed, but only abnormal results are  displayed) Labs Reviewed - No data to display  EKG None  Radiology DG Hand Complete Left  Result Date: 07/25/2021 CLINICAL DATA:  LEFT hand pain, no history of injury. EXAM: LEFT HAND - COMPLETE 3+ VIEW COMPARISON:  None FINDINGS: Suggestion of soft tissue swelling, no sign of acute bone finding or destructive bone process. IMPRESSION: Suggestion of soft tissue swelling without underlying bony abnormality. Electronically Signed   By: Donzetta Kohut M.D.   On: 07/25/2021 10:58    Procedures Procedures   Medications Ordered in ED Medications - No data to display  ED Course  I have reviewed the triage vital signs and the nursing notes.  Pertinent labs & imaging results that were available during my care of the patient were reviewed by me and considered in my medical decision making (see chart for details).    MDM Rules/Calculators/A&P                          Franklin Love is a 40 y.o. male  who presents to the emergency department for evaluation of left wrist and hand pain.  History and physical exam is reassuring for any compartment syndrome or severe ligamentous injury at this time.  He is neurovascularly intact at the wrist and in the fingers.  Good cap refill. Xray was negative for any fracture or dislocation. This is likely muscular versus developing carpal tunnel syndrome. I will have him follow-up with [facility] for further evaluation and possible Ortho referral if it is not getting better.  Strict return precautions given.  I have given naproxen for pain control and will have him use ice for swelling for this for 72 hours.  Final Clinical Impression(s) / ED Diagnoses Final diagnoses:  Left wrist pain    Rx / DC Orders ED Discharge Orders          Ordered    naproxen (NAPROSYN) 500 MG tablet  2 times daily        07/25/21 1430             Honor Loh [place], [place] 07/25/21 1544    Pollyann Savoy, MD 07/27/21 1256

## 2021-11-21 ENCOUNTER — Emergency Department (HOSPITAL_COMMUNITY): Payer: Self-pay

## 2021-11-21 ENCOUNTER — Emergency Department (HOSPITAL_COMMUNITY)
Admission: EM | Admit: 2021-11-21 | Discharge: 2021-11-21 | Disposition: A | Discharge status: Home / Self Care | Payer: Self-pay | Attending: Emergency Medicine | Admitting: Emergency Medicine

## 2021-11-21 ENCOUNTER — Encounter (HOSPITAL_COMMUNITY): Payer: Self-pay | Admitting: Emergency Medicine

## 2021-11-21 ENCOUNTER — Other Ambulatory Visit: Payer: Self-pay

## 2021-11-21 DIAGNOSIS — F17210 Nicotine dependence, cigarettes, uncomplicated: Secondary | ICD-10-CM | POA: Insufficient documentation

## 2021-11-21 DIAGNOSIS — M13869 Other specified arthritis, unspecified knee: Secondary | ICD-10-CM | POA: Insufficient documentation

## 2021-11-21 MED ORDER — NAPROXEN 500 MG PO TABS: 500.0000 mg | ORAL_TABLET | Freq: Two times a day (BID) | ORAL | 0 refills | Status: DC

## 2021-11-21 NOTE — ED Provider Triage Note (Signed)
Emergency Medicine Provider Triage Evaluation Note  Franklin Love , a 41 y.o. male  was evaluated in triage.  Pt complains of left knee pain.  Patient states he has intermittently had symptoms for a while, has been significantly worse in the past several days.  His pain even wakes him up from sleep.  He has been trying naproxen without relief.  Denies any recent illness or injury.  Review of Systems  Positive: Left knee pain Negative: Numbness, tingling  Physical Exam  BP (!) 114/94    Pulse 79    Temp 98.7 F (37.1 C) (Oral)    Ht 5\' 7"  (1.702 m)    Wt 86.2 kg    SpO2 98%    BMI 29.76 kg/m  Gen:   Awake, no distress   Resp:  Normal effort  MSK:   Moves extremities without difficulty  Other:  Tenderness to palpation over the patella, no obvious effusion.  No increased warmth of the joint  Medical Decision Making  Medically screening exam initiated at 12:58 PM.  Appropriate orders placed.  Franklin Love was informed that the remainder of the evaluation will be completed by another provider, this initial triage assessment does not replace that evaluation, and the importance of remaining in the ED until their evaluation is complete.    Cathryne Mancebo T, PA-C 11/21/21 1259

## 2021-11-21 NOTE — ED Triage Notes (Signed)
Pt complains of left knee pain. Pt denies any injury. States it has gotten progressively more painful. Pt is able to ambulate.

## 2021-11-21 NOTE — ED Notes (Signed)
Ortho tech applied knee brace. Pt verbalized understanding of d/c instructions, meds and followup care. Denies questions. VSS, no distress noted. Steady gait to exit with all belongings.

## 2021-11-21 NOTE — Progress Notes (Signed)
Orthopedic Tech Progress Note Patient Details:  Franklin Love [birth year] [id number]  Ortho Devices Type of Ortho Device: Knee Sleeve Ortho Device/Splint Location: LLE Ortho Device/Splint Interventions: Ordered, Application, Adjustment   Post Interventions Patient Tolerated: Well Instructions Provided: Care of device  [name] 11/21/2021, 5:55 PM
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
## 2021-11-21 NOTE — Discharge Instructions (Addendum)
You were seen in our emergency department diagnosed with right knee arthritis. Please use RICE therapy as discussed in the handout.  Please use NSAIDs such as naproxen for your pain.  Recommend over-the-counter.  Please return to emergency department with any exacerbation or changes in your pain.

## 2021-11-21 NOTE — ED Notes (Signed)
Ortho tech called for knee brace. 

## 2021-11-25 ENCOUNTER — Telehealth: Payer: Self-pay | Admitting: *Deleted

## 2021-11-25 ENCOUNTER — Other Ambulatory Visit: Payer: Self-pay

## 2021-11-25 ENCOUNTER — Encounter (HOSPITAL_COMMUNITY): Payer: Self-pay | Admitting: Emergency Medicine

## 2021-11-25 ENCOUNTER — Emergency Department (HOSPITAL_COMMUNITY): Payer: Self-pay

## 2021-11-25 ENCOUNTER — Emergency Department (HOSPITAL_COMMUNITY)
Admission: EM | Admit: 2021-11-25 | Discharge: 2021-11-25 | Disposition: A | Discharge status: Home / Self Care | Payer: Self-pay | Attending: Physician Assistant | Admitting: Physician Assistant

## 2021-11-25 ENCOUNTER — Other Ambulatory Visit (HOSPITAL_BASED_OUTPATIENT_CLINIC_OR_DEPARTMENT_OTHER): Payer: Self-pay

## 2021-11-25 DIAGNOSIS — M79671 Pain in right foot: Secondary | ICD-10-CM

## 2021-11-25 DIAGNOSIS — B353 Tinea pedis: Secondary | ICD-10-CM | POA: Insufficient documentation

## 2021-11-25 MED ORDER — DOXYCYCLINE HYCLATE 100 MG PO CAPS: 100.0000 mg | ORAL_CAPSULE | Freq: Two times a day (BID) | ORAL | 0 refills | Status: DC

## 2021-11-25 MED ORDER — CLOTRIMAZOLE 1 % EX CREA
TOPICAL_CREAM | CUTANEOUS | 0 refills | Status: DC
  Filled 2021-11-25: qty 15, fill #0

## 2021-11-25 MED ORDER — DOXYCYCLINE HYCLATE 100 MG PO CAPS
100.0000 mg | ORAL_CAPSULE | Freq: Two times a day (BID) | ORAL | 0 refills | Status: DC
  Filled 2021-11-25: qty 14, 7d supply, fill #0

## 2021-11-25 MED ORDER — CLOTRIMAZOLE 1 % EX CREA
TOPICAL_CREAM | CUTANEOUS | 0 refills | Status: AC
Start: 2021-11-25 — End: ?

## 2021-11-25 NOTE — ED Triage Notes (Signed)
Patient reports right foot pain with swelling onset Tuesday this week , denies injury , patient stated history of arthritis .

## 2021-11-25 NOTE — ED Provider Notes (Signed)
[facility] EMERGENCY DEPARTMENT Provider Note   CSN: [id number] Arrival date & time: 11/25/21  0134     History  Chief Complaint  Patient presents with   Foot Pain    Franklin Love is a 41 y.o. male.  HPI  41 year old male presenting to the emergency department today for evaluation of right foot pain.  States that he has had pain to the right foot for the last several days.  He is also had some swelling to the foot and some redness.  He denies any recent falls or trauma.  He has not had any fevers or chills.  Home Medications Prior to Admission medications   Medication Sig Start Date End Date Taking? Authorizing Provider  clotrimazole (LOTRIMIN) 1 % cream Apply to affected area 2 times daily 11/25/21  Yes Cody Albus S, PA-C  doxycycline (VIBRAMYCIN) 100 MG capsule Take 1 capsule (100 mg total) by mouth 2 (two) times daily for 7 days. 11/25/21 12/02/21 Yes Shelvy Heckert S, PA-C  naproxen (NAPROSYN) 500 MG tablet Take 1 tablet (500 mg total) by mouth 2 (two) times daily. 11/21/21   Camila Li, MD      Allergies    Patient has no known allergies.    Review of Systems   Review of Systems See HPI for pertinent positives or negatives.   Physical Exam Updated Vital Signs BP 137/84 (BP Location: Left Arm)    Pulse 71    Temp 98.1 F (36.7 C) (Oral)    Resp 20    SpO2 99%  Physical Exam Constitutional:      General: He is not in acute distress.    Appearance: He is well-developed.  Eyes:     Conjunctiva/sclera: Conjunctivae normal.  Cardiovascular:     Rate and Rhythm: Normal rate and regular rhythm.  Pulmonary:     Effort: Pulmonary effort is normal.     Breath sounds: Normal breath sounds.  Musculoskeletal:     Comments: TTP, swelling and erythema to the dorsum of the right foot over an area that encompasses about 2cmx4cm. Pt also with tinea pedis noted between the 4th and 5th toes which is where the erythema originates. DP pulse is intact. No  RLE edema or calf TTP.   Skin:    General: Skin is warm and dry.  Neurological:     Mental Status: He is alert and oriented to person, place, and time.     ED Results / Procedures / Treatments   Labs (all labs ordered are listed, but only abnormal results are displayed) Labs Reviewed - No data to display  EKG None  Radiology DG Foot Complete Right  Result Date: 11/25/2021 CLINICAL DATA:  Right foot pain with swelling. History of arthritis. EXAM: RIGHT FOOT COMPLETE - 3+ VIEW COMPARISON:  01/12/2021. FINDINGS: There is no evidence of fracture or dislocation. Degenerative changes are present in the midfoot and first metatarsophalangeal joint. Soft tissues are unremarkable. IMPRESSION: 1. No acute osseous abnormality. 2. Degenerative changes in the midfoot and first metatarsophalangeal joint. Electronically Signed   By: Thornell Sartorius M.D.   On: 11/25/2021 02:49    Procedures Procedures    Medications Ordered in ED Medications - No data to display  ED Course/ Medical Decision Making/ A&P                           Medical Decision Making Amount and/or Complexity of Data Reviewed Radiology: ordered.  41 year old male presents the emergency department today for evaluation of redness pain and swelling to the right foot.  On exam it appears that he has a mild cellulitis to the dorsum of the right foot.  This likely originates from an open wound between the fourth and fifth digit secondary to tinea pedis.  Low concern for necrotizing infection, osteomyelitis or other emergent cause of symptoms.  Doubt DVT.  Doubt arterial emergency.  X-ray reviewed/interpreted and negative other than arthritis.  No obvious stress fracture or other abnormality noted.  Patient will be given doxycycline to cover for cellulitis as well as clotrimazole for his tinea pedis.  He is advised on follow-up and return precautions.  He voices understanding of plan and reasons to return.  All questions answered.  Patient  stable for discharge.   Final Clinical Impression(s) / ED Diagnoses Final diagnoses:  Right foot pain  Tinea pedis of right foot    Rx / DC Orders ED Discharge Orders          Ordered    doxycycline (VIBRAMYCIN) 100 MG capsule  2 times daily        11/25/21 0326    clotrimazole (LOTRIMIN) 1 % cream        11/25/21 0326              Karrie Meres, PA-C 11/25/21 0332    Zadie Rhine, MD 11/25/21 [phone]

## 2021-11-25 NOTE — ED Provider Triage Note (Signed)
Emergency Medicine Provider Triage Evaluation Note  Franklin Love , a 41 y.o. male  was evaluated in triage.  Pt complains of right foot pain that has been ongoing for a few days. Reports swelling and redness as well. Denies fevers.  Review of Systems  Positive: Redness, swelling, pain to the right foot Negative: fever  Physical Exam  BP 137/84 (BP Location: Left Arm)    Pulse 71    Temp 98.1 F (36.7 C) (Oral)    Resp 20    SpO2 99%  Gen:   Awake, no distress   Resp:  Normal effort  MSK:   Moves extremities without difficulty  Other:  Erythema, warmth and TTP to the dorsum of the right foot. Tinea pedis noted between the 4th and 5th metatarsals. DP pulse is 2+  Medical Decision Making  Medically screening exam initiated at 2:05 AM.  Appropriate orders placed.  Franklin Love was informed that the remainder of the evaluation will be completed by another provider, this initial triage assessment does not replace that evaluation, and the importance of remaining in the ED until their evaluation is complete.     Karrie Meres, [place] 11/25/21 0207

## 2021-11-25 NOTE — Discharge Instructions (Addendum)
Use the clotrimazole between your toes to treat the athlete's foot  Take the antibiotics as directed to treat your skin infection  Please follow up with your primary care provider within 5-7 days for re-evaluation of your symptoms. If you do not have a primary care provider, information for a healthcare clinic has been provided for you to make arrangements for follow up care. Please return to the emergency department for any new or worsening symptoms.

## 2021-11-25 NOTE — Telephone Encounter (Signed)
Pt called regarding which pharmacy Rx was e-scribed to.  RNCM reviewed chart to access After Visit Summary and found that Rx was sent to [facility] on [address].  Pt asked to have it sent to [facility] on [address]  RNCM called in Rx as written to pharmacy of choice.  Advised pt to call pharmacy for a pick-up time.

## 2021-11-27 ENCOUNTER — Other Ambulatory Visit: Payer: Self-pay

## 2021-11-27 ENCOUNTER — Encounter (HOSPITAL_COMMUNITY): Payer: Self-pay | Admitting: Emergency Medicine

## 2021-11-27 ENCOUNTER — Observation Stay (HOSPITAL_COMMUNITY)
Admission: EM | Admit: 2021-11-27 | Discharge: 2021-11-29 | Disposition: A | Discharge status: Home / Self Care | Payer: Medicaid Other | Attending: Internal Medicine | Admitting: Internal Medicine

## 2021-11-27 DIAGNOSIS — Z20822 Contact with and (suspected) exposure to covid-19: Secondary | ICD-10-CM | POA: Insufficient documentation

## 2021-11-27 DIAGNOSIS — L03115 Cellulitis of right lower limb: Principal | ICD-10-CM | POA: Insufficient documentation

## 2021-11-27 DIAGNOSIS — L03818 Cellulitis of other sites: Secondary | ICD-10-CM

## 2021-11-27 DIAGNOSIS — F17210 Nicotine dependence, cigarettes, uncomplicated: Secondary | ICD-10-CM | POA: Insufficient documentation

## 2021-11-27 NOTE — ED Triage Notes (Signed)
Pt c/o continued right foot pain and swelling. Stated he was started on abx and given a cream, no change.

## 2021-11-28 ENCOUNTER — Encounter (HOSPITAL_COMMUNITY): Payer: Self-pay | Admitting: Internal Medicine

## 2021-11-28 DIAGNOSIS — L03115 Cellulitis of right lower limb: Secondary | ICD-10-CM

## 2021-11-28 LAB — CBC WITH DIFFERENTIAL/PLATELET
Abs Immature Granulocytes: 0.03 10*3/uL (ref 0.00–0.07)
Basophils Absolute: 0 10*3/uL (ref 0.0–0.1)
Basophils Relative: 0 %
Eosinophils Absolute: 0.4 10*3/uL (ref 0.0–0.5)
Eosinophils Relative: 3 %
HCT: 37.2 % — ABNORMAL LOW (ref 39.0–52.0)
Hemoglobin: 12 g/dL — ABNORMAL LOW (ref 13.0–17.0)
Immature Granulocytes: 0 %
Lymphocytes Relative: 27 %
Lymphs Abs: 3.2 10*3/uL (ref 0.7–4.0)
MCH: 29.9 pg (ref 26.0–34.0)
MCHC: 32.3 g/dL (ref 30.0–36.0)
MCV: 92.8 fL (ref 80.0–100.0)
Monocytes Absolute: 1.3 10*3/uL — ABNORMAL HIGH (ref 0.1–1.0)
Monocytes Relative: 11 %
Neutro Abs: 7.2 10*3/uL (ref 1.7–7.7)
Neutrophils Relative %: 59 %
Platelets: 196 10*3/uL (ref 150–400)
RBC: 4.01 MIL/uL — ABNORMAL LOW (ref 4.22–5.81)
RDW: 12.2 % (ref 11.5–15.5)
WBC: 12.1 10*3/uL — ABNORMAL HIGH (ref 4.0–10.5)
nRBC: 0 % (ref 0.0–0.2)

## 2021-11-28 LAB — C-REACTIVE PROTEIN: CRP: 2.8 mg/dL — ABNORMAL HIGH (ref <1.0)

## 2021-11-28 LAB — HIV ANTIBODY (ROUTINE TESTING W REFLEX): HIV Screen 4th Generation wRfx: NONREACTIVE

## 2021-11-28 LAB — BASIC METABOLIC PANEL
Anion gap: 7 (ref 5–15)
BUN: 19 mg/dL (ref 6–20)
CO2: 26 mmol/L (ref 22–32)
Calcium: 9.2 mg/dL (ref 8.9–10.3)
Chloride: 108 mmol/L (ref 98–111)
Creatinine, Ser: 0.99 mg/dL (ref 0.61–1.24)
GFR, Estimated: >60 mL/min (ref >60)
Glucose, Bld: 110 mg/dL — ABNORMAL HIGH (ref 70–99)
Potassium: 4.3 mmol/L (ref 3.5–5.1)
Sodium: 141 mmol/L (ref 135–145)

## 2021-11-28 LAB — RESP PANEL BY RT-PCR (FLU A&B, COVID) ARPGX2
Influenza A by PCR: NEGATIVE
Influenza B by PCR: NEGATIVE
SARS Coronavirus 2 by RT PCR: NEGATIVE

## 2021-11-28 LAB — SEDIMENTATION RATE: Sed Rate: 13 mm/hr (ref 0–16)

## 2021-11-28 MED ORDER — DOCUSATE SODIUM 100 MG PO CAPS
100.0000 mg | ORAL_CAPSULE | Freq: Every day | ORAL | Status: DC
  Administered 2021-11-29: 100 mg via ORAL
  Filled 2021-11-28 (×2): qty 1

## 2021-11-28 MED ORDER — ONDANSETRON HCL 4 MG/2ML IJ SOLN: 4.0000 mg | Freq: Four times a day (QID) | INTRAMUSCULAR | Status: DC | PRN

## 2021-11-28 MED ORDER — ACETAMINOPHEN 650 MG RE SUPP: 650.0000 mg | Freq: Four times a day (QID) | RECTAL | Status: DC | PRN

## 2021-11-28 MED ORDER — CEFAZOLIN SODIUM-DEXTROSE 1-4 GM/50ML-% IV SOLN
1.0000 g | Freq: Three times a day (TID) | INTRAVENOUS | Status: DC
  Administered 2021-11-28 – 2021-11-29 (×4): 1 g via INTRAVENOUS
  Filled 2021-11-28 (×5): qty 50

## 2021-11-28 MED ORDER — MELATONIN 5 MG PO TABS: 10.0000 mg | ORAL_TABLET | Freq: Every evening | ORAL | Status: DC | PRN

## 2021-11-28 MED ORDER — FLUCONAZOLE 100 MG PO TABS
200.0000 mg | ORAL_TABLET | Freq: Every day | ORAL | Status: DC
  Administered 2021-11-28 – 2021-11-29 (×2): 200 mg via ORAL
  Filled 2021-11-28: qty 1
  Filled 2021-11-28: qty 2

## 2021-11-28 MED ORDER — ACETAMINOPHEN 325 MG PO TABS: 650.0000 mg | ORAL_TABLET | Freq: Four times a day (QID) | ORAL | Status: DC | PRN

## 2021-11-28 MED ORDER — KETOROLAC TROMETHAMINE 15 MG/ML IJ SOLN
15.0000 mg | Freq: Once | INTRAMUSCULAR | Status: AC
  Administered 2021-11-28: 15 mg via INTRAVENOUS
  Filled 2021-11-28: qty 1

## 2021-11-28 MED ORDER — VANCOMYCIN HCL 1750 MG/350ML IV SOLN
1750.0000 mg | Freq: Once | INTRAVENOUS | Status: AC
  Administered 2021-11-28: 1750 mg via INTRAVENOUS
  Filled 2021-11-28: qty 350

## 2021-11-28 MED ORDER — HYDROCODONE-ACETAMINOPHEN 5-325 MG PO TABS
1.0000 | ORAL_TABLET | Freq: Once | ORAL | Status: AC
  Administered 2021-11-28: 1 via ORAL
  Filled 2021-11-28: qty 1

## 2021-11-28 MED ORDER — OXYCODONE HCL 5 MG PO TABS
5.0000 mg | ORAL_TABLET | ORAL | Status: DC | PRN
  Administered 2021-11-28 – 2021-11-29 (×6): 5 mg via ORAL
  Filled 2021-11-28 (×6): qty 1

## 2021-11-28 MED ORDER — ONDANSETRON HCL 4 MG PO TABS: 4.0000 mg | ORAL_TABLET | Freq: Four times a day (QID) | ORAL | Status: DC | PRN

## 2021-11-28 MED ORDER — MICONAZOLE NITRATE POWD
Freq: Three times a day (TID) | Status: DC
  Filled 2021-11-28: qty 100

## 2021-11-28 NOTE — ED Notes (Signed)
Admitting Provider at bedside. 

## 2021-11-28 NOTE — Progress Notes (Signed)
[facility] PROGRESS NOTE    Progress Note  Franklin Love  [id number] DOB: 05-Aug-1981 DOA: 11/27/2021 PCP: Patient, No Pcp Per (Inactive)     Brief Narrative:   Franklin Love is an 41 y.o. male with no past medical history comes into the ED for right foot cellulitis that started 3 days prior to admission was seen in the ED during that time was started on doxycycline but he relates that his erythema and pain progressively got worse.  Assessment/Plan:   Cellulitis of right foot/tinea pedis: Tmax of 98.8, with a white count of 12 with a left shift. Started empirically on IV vancomycin.,  Has now been transitioned to IV cefazolin. Blood cultures are pending. Start IV Diflucan he does have tinea pedis  DVT prophylaxis: lovenox Family Communication:none Status is: Observation The patient remains OBS appropriate and will d/c before 2 midnights.     Code Status:     Code Status Orders  (From admission, onward)           Start     Ordered   11/28/21 0218  Full code  Continuous        11/28/21 0217           Code Status History     Date Active Date Inactive Code Status Order ID Comments User Context   11/28/2021 0158 11/28/2021 0217 Full Code [id number]  Kristopher Oppenheim, DO ED         IV Access:   Peripheral IV   Procedures and diagnostic studies:   No results found.   Medical Consultants:   None.   Subjective:    Franklin Love still with  right lower ext pain  Objective:    Vitals:   11/28/21 0500 11/28/21 0600 11/28/21 0700 11/28/21 0804  BP: (!) 104/56 107/65 (!) 98/58 109/68  Pulse: (!) 57 (!) 53 (!) 54 (!) 50  Resp: 16 16 18 16   Temp:      TempSrc:      SpO2: 99% 99% 100% 100%   SpO2: 100 %   Intake/Output Summary (Last 24 hours) at 11/28/2021 1100 Last data filed at 11/28/2021 [id number] Gross per 24 hour  Intake 1600 ml  Output --  Net 1600 ml   There were no vitals filed for this visit.  Exam: General exam: In no acute  distress. Respiratory system: Good air movement and clear to auscultation. Cardiovascular system: S1 & S2 heard, RRR. No JVD. Gastrointestinal system: Abdomen is nondistended, soft and nontender.  Extremities: right foot cellulitis  Skin: No rashes, lesions or ulcers Psychiatry: Judgement and insight appear normal. Mood & affect appropriate.    Data Reviewed:    Labs: Basic Metabolic Panel: Recent Labs  Lab 11/28/21 0045  NA 141  K 4.3  CL 108  CO2 26  GLUCOSE 110*  BUN 19  CREATININE 0.99  CALCIUM 9.2   GFR Estimated Creatinine Clearance: 102.9 mL/min (by C-G formula based on SCr of 0.99 mg/dL). Liver Function Tests: No results for input(s): AST, ALT, ALKPHOS, BILITOT, PROT, ALBUMIN in the last 168 hours. No results for input(s): LIPASE, AMYLASE in the last 168 hours. No results for input(s): AMMONIA in the last 168 hours. Coagulation profile No results for input(s): INR, PROTIME in the last 168 hours. COVID-19 Labs  Recent Labs    11/28/21 0045  CRP 2.8*    Lab Results  Component Value Date   SARSCOV2NAA NEGATIVE 11/28/2021   SARSCOV2NAA Not Detected 12/02/2019   SARSCOV2NAA Detected (A) 11/21/2019  [place] Not Detected 09/09/2019    CBC: Recent Labs  Lab 11/28/21 0045  WBC 12.1*  NEUTROABS 7.2  HGB 12.0*  HCT 37.2*  MCV 92.8  PLT 196   Cardiac Enzymes: No results for input(s): CKTOTAL, CKMB, CKMBINDEX, TROPONINI in the last 168 hours. BNP (last 3 results) No results for input(s): PROBNP in the last 8760 hours. CBG: No results for input(s): GLUCAP in the last 168 hours. D-Dimer: No results for input(s): DDIMER in the last 72 hours. Hgb A1c: No results for input(s): HGBA1C in the last 72 hours. Lipid Profile: No results for input(s): CHOL, HDL, LDLCALC, TRIG, CHOLHDL, LDLDIRECT in the last 72 hours. Thyroid function studies: No results for input(s): TSH, T4TOTAL, T3FREE, THYROIDAB in the last 72 hours.  Invalid input(s): FREET3 Anemia  work up: No results for input(s): VITAMINB12, FOLATE, FERRITIN, TIBC, IRON, RETICCTPCT in the last 72 hours. Sepsis Labs: Recent Labs  Lab 11/28/21 0045  WBC 12.1*   Microbiology Recent Results (from the past 240 hour(s))  Resp Panel by RT-PCR (Flu A&B, Covid) Nasopharyngeal Swab     Status: None   Collection Time: 11/28/21  2:20 AM   Specimen: Nasopharyngeal Swab; Nasopharyngeal(NP) swabs in vial transport medium  Result Value Ref Range Status   SARS Coronavirus 2 by RT PCR NEGATIVE NEGATIVE Final    Comment: (NOTE) SARS-CoV-2 target nucleic acids are NOT DETECTED.  The SARS-CoV-2 RNA is generally detectable in upper respiratory specimens during the acute phase of infection. The lowest concentration of SARS-CoV-2 viral copies this assay can detect is 138 copies/mL. A negative result does not preclude SARS-Cov-2 infection and should not be used as the sole basis for treatment or other patient management decisions. A negative result may occur with  improper specimen collection/handling, submission of specimen other than nasopharyngeal swab, presence of viral mutation(s) within the areas targeted by this assay, and inadequate number of viral copies(<138 copies/mL). A negative result must be combined with clinical observations, patient history, and epidemiological information. The expected result is Negative.  Fact Sheet for Patients:  EntrepreneurPulse.com.au  Fact Sheet for Healthcare Providers:  IncredibleEmployment.be  This test is no t yet approved or cleared by the [place] FDA and  has been authorized for detection and/or diagnosis of SARS-CoV-2 by FDA under an Emergency Use Authorization (EUA). This EUA will remain  in effect (meaning this test can be used) for the duration of the COVID-19 declaration under Section 564(b)(1) of the Act, 21 U.S.C.section 360bbb-3(b)(1), unless the authorization is terminated  or revoked sooner.        Influenza A by PCR NEGATIVE NEGATIVE Final   Influenza B by PCR NEGATIVE NEGATIVE Final    Comment: (NOTE) The Xpert Xpress SARS-CoV-2/FLU/RSV plus assay is intended as an aid in the diagnosis of influenza from Nasopharyngeal swab specimens and should not be used as a sole basis for treatment. Nasal washings and aspirates are unacceptable for Xpert Xpress SARS-CoV-2/FLU/RSV testing.  Fact Sheet for Patients: EntrepreneurPulse.com.au  Fact Sheet for Healthcare Providers: IncredibleEmployment.be  This test is not yet approved or cleared by the [place] FDA and has been authorized for detection and/or diagnosis of SARS-CoV-2 by FDA under an Emergency Use Authorization (EUA). This EUA will remain in effect (meaning this test can be used) for the duration of the COVID-19 declaration under Section 564(b)(1) of the Act, 21 U.S.C. section 360bbb-3(b)(1), unless the authorization is terminated or revoked.  Performed at [facility], [place] [address] [place], [place] [address]  Medications:    docusate sodium  100 mg Oral Daily   fluconazole  200 mg Oral Daily   miconazole nitrate   Topical TID   Continuous Infusions:   ceFAZolin (ANCEF) IV Stopped (11/28/21 0622)      LOS: 0 days   Charlynne Cousins  [facility]  11/28/2021, 11:00 AM

## 2021-11-28 NOTE — ED Provider Notes (Signed)
[facility] EMERGENCY DEPARTMENT Provider Note  CSN: [id number] Arrival date & time: 11/27/21 2309  Chief Complaint(s) Foot Pain  HPI Franklin Love is a 41 y.o. male who presents emergency department for evaluation of right foot pain.  Patient was seen on 11/25/2021 for cellulitis of the toe secondary to a skin break between the webspace of the fourth and fifth digit on the right.  Patient was started on doxycycline and Lotrimin but over the last 48 hours his cellulitis has extended to the dorsum of the midfoot with significant associated swelling.  He denies numbness, tingling, weakness of lower extremity.  Denies fever, chest pain, shortness of breath, abdominal pain, nausea, vomiting or other systemic symptoms.   Foot Pain   Past Medical History History reviewed. No pertinent past medical history. Patient Active Problem List   Diagnosis Date Noted   Neck pain 06/23/2020   Acute bilateral low back pain without sciatica 06/23/2020   Home Medication(s) Prior to Admission medications   Medication Sig Start Date End Date Taking? Authorizing Provider  clotrimazole (LOTRIMIN) 1 % cream Apply to affected area 2 times daily 11/25/21   Couture, Cortni S, PA-C  doxycycline (VIBRAMYCIN) 100 MG capsule Take 1 capsule (100 mg total) by mouth 2 (two) times daily for 7 days. 11/25/21 12/02/21  Couture, Cortni S, PA-C  naproxen (NAPROSYN) 500 MG tablet Take 1 tablet (500 mg total) by mouth 2 (two) times daily. 11/21/21   Zachery Dakins, MD                                                                                                                                    Past Surgical History History reviewed. No pertinent surgical history. Family History No family history on file.  Social History Social History   Tobacco Use   Smoking status: Every Day    Packs/day: 0.50    Types: Cigarettes   Smokeless tobacco: Never  Substance Use Topics   Alcohol use: Yes    Comment: "2  or 3 shots every couple days":   Drug use: No   Allergies Patient has no known allergies.  Review of Systems Review of Systems  Musculoskeletal:  Positive for joint swelling.  Skin:  Positive for rash.   Physical Exam Vital Signs  I have reviewed the triage vital signs BP (!) 142/68    Pulse 87    Temp 98.8 F (37.1 C) (Oral)    Resp 16    SpO2 98%   Physical Exam Vitals and nursing note reviewed.  Constitutional:      General: He is not in acute distress.    Appearance: He is well-developed.  HENT:     Head: Normocephalic and atraumatic.  Eyes:     Conjunctiva/sclera: Conjunctivae normal.  Cardiovascular:     Rate and Rhythm: Normal rate and regular rhythm.     Heart sounds: No murmur heard. Pulmonary:  Effort: Pulmonary effort is normal. No respiratory distress.     Breath sounds: Normal breath sounds.  Abdominal:     Palpations: Abdomen is soft.     Tenderness: There is no abdominal tenderness.  Musculoskeletal:        General: Swelling present.     Cervical back: Neck supple.  Skin:    General: Skin is warm and dry.     Capillary Refill: Capillary refill takes less than 2 seconds.     Findings: Erythema present.  Neurological:     Mental Status: He is alert.  Psychiatric:        Mood and Affect: Mood normal.    ED Results and Treatments Labs (all labs ordered are listed, but only abnormal results are displayed) Labs Reviewed  CBC WITH DIFFERENTIAL/PLATELET  BASIC METABOLIC PANEL  SEDIMENTATION RATE  C-REACTIVE PROTEIN                                                                                                                          Radiology No results found.  Pertinent labs & imaging results that were available during my care of the patient were reviewed by me and considered in my medical decision making (see MDM for details).  Medications Ordered in ED Medications  vancomycin (VANCOREADY) IVPB 1750 mg/350 mL (has no administration in time  range)  HYDROcodone-acetaminophen (NORCO/VICODIN) 5-325 MG per tablet 1 tablet (has no administration in time range)                                                                                                                                     Procedures Procedures  (including critical care time)  Medical Decision Making / ED Course   This patient presents to the ED for concern of foot pain, this involves an extensive number of treatment options, and is a complaint that carries with it a high risk of complications and morbidity.  The differential diagnosis includes cellulitis, fracture, retained foreign body  MDM: Patient seen emergency department for evaluation of right lower extremity cellulitis.  Physical exam with erythema and swelling that extends up to the dorsum of the midfoot.  Laboratory evaluation with leukocytosis to 12.1 but is otherwise unremarkable.  Patient presentation consistent with cellulitis with failure of outpatient antibiotics.  Patient offered both dalbavancin and discharge versus vancomycin and admission and patient requesting admission.  This is not  unreasonable given failure of outpatient antibiotics and worsening erythema.  Patient then admitted for observation and IV antibiotics.  Vancomycin started.   Additional history obtained:  -External records from outside source obtained and reviewed including: Chart review including previous notes, labs, imaging, consultation notes   Lab Tests: -I ordered, reviewed, and interpreted labs.   The pertinent results include:   Labs Reviewed  CBC WITH DIFFERENTIAL/PLATELET  BASIC METABOLIC PANEL  SEDIMENTATION RATE  C-REACTIVE PROTEIN     Medicines ordered and prescription drug management: Meds ordered this encounter  Medications   vancomycin (VANCOREADY) IVPB 1750 mg/350 mL    Order Specific Question:   Indication:    Answer:   Cellulitis   HYDROcodone-acetaminophen (NORCO/VICODIN) 5-325 MG per tablet 1  tablet    -I have reviewed the patients home medicines and have made adjustments as needed  Critical interventions none   Cardiac Monitoring: The patient was maintained on a cardiac monitor.  I personally viewed and interpreted the cardiac monitored which showed an underlying rhythm of: NSR  Social Determinants of Health:  Factors impacting patients care include: none   Reevaluation: After the interventions noted above, I reevaluated the patient and found that they have :stayed the same  Co morbidities that complicate the patient evaluation History reviewed. No pertinent past medical history.    Dispostion: I considered admission for this patient, and due to failure of outpatient antibiotics patient will be admitted.     Final Clinical Impression(s) / ED Diagnoses Final diagnoses:  None     @PCDICTATION @    Teressa Lower, MD 11/28/21 [phone]

## 2021-11-28 NOTE — ED Notes (Signed)
Pt given sandwich bag and water per request. No acute changes noted. Pt also given warm blanket. Lights off, side rails up x1, call bell within reach. Will continue to monitor.

## 2021-11-28 NOTE — Subjective & Objective (Signed)
CC: right foot pain HPI: 41 yo AAM with no medical history, presents back to ER for continued right foot cellulitis. Seen in ER 3 days ago. Sent home with po doxycycline. Returns for worsening erythema, swelling and pain in right foot. + throbbing in his feet when in a dependent position.  Patient noted a crack in between his fourth and fifth right toe several days ago.  He states that he wears socks all the time.  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  He does not dry his feet after he gets out of shower.  He mainly puts on socks afterwards.  On admission the ER temp 98.8 heart rate 87 blood pressure 142/68.  Labs White count 12.1, hemoglobin 12, platelets 186  CRP slightly evaluated at 2.8.  Chemistry unremarkable except for glucose of 110.  Due to failed outpatient therapy with antibiotics, [facility] contacted for admission.  EDP did offer the patient outpatient treatment with dalbavancin but the patient declined.

## 2021-11-28 NOTE — H&P (Signed)
History and Physical    Franklin Love [id number] DOB: [birth year] DOA: 11/27/2021  DOS: the patient was seen and examined on 11/27/2021  PCP: Patient, No Pcp Per (Inactive)   Patient coming from: Home  I have personally briefly reviewed patient's old medical records in [place]  CC: right foot pain HPI: 41 yo AAM with no medical history, presents back to ER for continued right foot cellulitis. Seen in ER 3 days ago. Sent home with po doxycycline. Returns for worsening erythema, swelling and pain in right foot. + throbbing in his feet when in a dependent position.  Patient noted a crack in between his fourth and fifth right toe several days ago.  He states that he wears socks all the time.  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  He does not dry his feet after he gets out of shower.  He mainly puts on socks afterwards.  On admission the ER temp 98.8 heart rate 87 blood pressure 142/68.  Labs White count 12.1, hemoglobin 12, platelets 186  CRP slightly evaluated at 2.8.  Chemistry unremarkable except for glucose of 110.  Due to failed outpatient therapy with antibiotics, [facility] contacted for admission.  EDP did offer the patient outpatient treatment with dalbavancin but the patient declined.   ED Course: mild leukocytosis. Serum glucose 110  Review of Systems:  Review of Systems  Constitutional:  Negative for chills and fever.  HENT: Negative.    Eyes: Negative.   Respiratory: Negative.    Cardiovascular: Negative.   Gastrointestinal: Negative.   Genitourinary: Negative.   Musculoskeletal: Negative.   Skin:        Increase in pain, erythema, swelling right foot  Neurological: Negative.   Endo/Heme/Allergies: Negative.   Psychiatric/Behavioral: Negative.    All other systems reviewed and are negative.  History reviewed. No pertinent past medical history.  History reviewed. No pertinent surgical history.   reports that he  has been smoking cigarettes. He has been smoking an average of .5 packs per day. He has never used smokeless tobacco. He reports current alcohol use. He reports that he does not use drugs.  No Known Allergies  No family history on file.  Prior to Admission medications   Medication Sig Start Date End Date Taking? Authorizing Provider  clotrimazole (LOTRIMIN) 1 % cream Apply to affected area 2 times daily 11/25/21  Yes Couture, Cortni S, PA-C  doxycycline (VIBRAMYCIN) 100 MG capsule Take 1 capsule (100 mg total) by mouth 2 (two) times daily for 7 days. Patient taking differently: Take 100 mg by mouth See admin instructions. Bid x 7 days 11/25/21 12/02/21 Yes Couture, Cortni S, PA-C  GLUCOSAMINE-CHONDROITIN PO Take 1 capsule by mouth daily.   Yes [provider]  naproxen (NAPROSYN) 500 MG tablet Take 1 tablet (500 mg total) by mouth 2 (two) times daily. 11/21/21  Yes Zachery Dakins, MD    Physical Exam: Vitals:   11/27/21 2313 11/28/21 0055 11/28/21 0110  BP: (!) 142/68 131/77 126/79  Pulse: 87 80 76  Resp: 16 18 16   Temp: 98.8 F (37.1 C)    TempSrc: Oral    SpO2: 98% 100% 99%    Physical Exam Vitals and nursing note reviewed.  Constitutional:      General: He is not in acute distress.    Appearance: Normal appearance. He is normal weight. He is not ill-appearing, toxic-appearing or diaphoretic.  HENT:     Head: Normocephalic and  atraumatic.     Nose: Nose normal. No rhinorrhea.  Eyes:     General: No scleral icterus. Cardiovascular:     Rate and Rhythm: Normal rate and regular rhythm.     Pulses: Normal pulses.  Pulmonary:     Effort: Pulmonary effort is normal. No respiratory distress.     Breath sounds: Normal breath sounds. No wheezing or rales.  Abdominal:     General: Abdomen is flat. Bowel sounds are normal. There is no distension.     Tenderness: There is no abdominal tenderness. There is no guarding or rebound.  Musculoskeletal:     Left lower leg: No  edema.  Skin:    Capillary Refill: Capillary refill takes less than 2 seconds.     Findings: Erythema and lesion present.     Comments: Erythema over dorsum of right foot. Small abrasion/laceration in-between right 4/5 toes Mild edema of dorsum of right foot +pain with palpation of dorsum of right foot.  Neurological:     General: No focal deficit present.     Mental Status: He is alert and oriented to person, place, and time.     Labs on Admission: I have personally reviewed following labs and imaging studies  CBC: Recent Labs  Lab 11/28/21 0045  WBC 12.1*  NEUTROABS 7.2  HGB 12.0*  HCT 37.2*  MCV 92.8  PLT 123456   Basic Metabolic Panel: Recent Labs  Lab 11/28/21 0045  NA 141  K 4.3  CL 108  CO2 26  GLUCOSE 110*  BUN 19  CREATININE 0.99  CALCIUM 9.2   GFR: Estimated Creatinine Clearance: 102.9 mL/min (by C-G formula based on SCr of 0.99 mg/dL). Liver Function Tests: No results for input(s): AST, ALT, ALKPHOS, BILITOT, PROT, ALBUMIN in the last 168 hours. No results for input(s): LIPASE, AMYLASE in the last 168 hours. No results for input(s): AMMONIA in the last 168 hours. Coagulation Profile: No results for input(s): INR, PROTIME in the last 168 hours. Cardiac Enzymes: No results for input(s): CKTOTAL, CKMB, CKMBINDEX, TROPONINI in the last 168 hours. BNP (last 3 results) No results for input(s): PROBNP in the last 8760 hours. HbA1C: No results for input(s): HGBA1C in the last 72 hours. CBG: No results for input(s): GLUCAP in the last 168 hours. Lipid Profile: No results for input(s): CHOL, HDL, LDLCALC, TRIG, CHOLHDL, LDLDIRECT in the last 72 hours. Thyroid Function Tests: No results for input(s): TSH, T4TOTAL, FREET4, T3FREE, THYROIDAB in the last 72 hours. Anemia Panel: No results for input(s): VITAMINB12, FOLATE, FERRITIN, TIBC, IRON, RETICCTPCT in the last 72 hours. Urine analysis:    Component Value Date/Time   COLORURINE YELLOW 01/02/2014 1215    APPEARANCEUR CLOUDY (A) 01/02/2014 1215   LABSPEC 1.024 01/02/2014 1215   PHURINE 5.5 01/02/2014 1215   GLUCOSEU NEGATIVE 01/02/2014 1215   HGBUR LARGE (A) 01/02/2014 1215   BILIRUBINUR NEGATIVE 01/02/2014 1215   KETONESUR NEGATIVE 01/02/2014 1215   PROTEINUR NEGATIVE 01/02/2014 1215   UROBILINOGEN 0.2 01/02/2014 1215   NITRITE NEGATIVE 01/02/2014 1215   LEUKOCYTESUR LARGE (A) 01/02/2014 1215    Radiological Exams on Admission: I have personally reviewed images No results found.  EKG: I have personally reviewed EKG: no EKG  Assessment/Plan Principal Problem:   Cellulitis of right foot    Assessment and Plan: * Cellulitis of right foot- (present on admission) Observation med/surg bed. IV ancef, po diflucan. Po oxycodone for pain. Discussed good foot care. Wash/dry feet carefully each day after work. Dry in-between toes  with clean/laundered towel(do not reuse towel unless it's been washed/dried). Using anti-fungal spray or powder. Allow feet to air dry. Make sure work boots are dry before wearing them again.    DVT prophylaxis: SCDs Code Status: Full Code Family Communication: no family at bedside  Disposition Plan: return home  Consults called: none  Admission status: Observation, Med-Surg   Kristopher Oppenheim, DO [facility] 11/28/2021, 2:05 AM

## 2021-11-28 NOTE — ED Notes (Signed)
Pt was ambulatory to bathroom  

## 2021-11-28 NOTE — Assessment & Plan Note (Signed)
Observation med/surg bed. IV ancef, po diflucan. Po oxycodone for pain. Discussed good foot care. Wash/dry feet carefully each day after work. Dry in-between toes with clean/laundered towel(do not reuse towel unless it's been washed/dried). Using anti-fungal spray or powder. Allow feet to air dry. Make sure work boots are dry before wearing them again.

## 2021-11-28 NOTE — ED Notes (Signed)
Breakfast Orders Placed °

## 2021-11-29 ENCOUNTER — Other Ambulatory Visit (HOSPITAL_COMMUNITY): Payer: Self-pay

## 2021-11-29 MED ORDER — CEPHALEXIN 500 MG PO CAPS
500.0000 mg | ORAL_CAPSULE | Freq: Four times a day (QID) | ORAL | Status: DC
  Administered 2021-11-29: 500 mg via ORAL
  Filled 2021-11-29: qty 1

## 2021-11-29 MED ORDER — FLUCONAZOLE 200 MG PO TABS
200.0000 mg | ORAL_TABLET | Freq: Every day | ORAL | 0 refills | Status: AC
  Filled 2021-11-29: qty 5, 5d supply, fill #0

## 2021-11-29 MED ORDER — CEPHALEXIN 500 MG PO CAPS
500.0000 mg | ORAL_CAPSULE | Freq: Four times a day (QID) | ORAL | 0 refills | Status: AC
  Filled 2021-11-29: qty 28, 7d supply, fill #0

## 2021-11-29 MED ORDER — MICONAZOLE NITRATE POWD
1.0000 "application " | Freq: Three times a day (TID) | 0 refills | Status: AC
  Filled 2021-11-29: qty 1, 1d supply, fill #0

## 2021-11-29 NOTE — Discharge Summary (Signed)
Physician Discharge Summary  Franklin SeatsMarquis Love EAV:[id number] DOB: [birth year] DOA: 11/27/2021  PCP: Patient, No Pcp Per (Inactive)  Admit date: 11/27/2021 Discharge date: 11/29/2021  Admitted From: Home Disposition:  Home  Recommendations for Outpatient Follow-up:  Follow up with PCP in 1-2 weeks Please obtain BMP/CBC in one week   Home Health:no Equipment/Devices:none  Discharge Condition:Stable CODE STATUS:Full Diet recommendation: Heart Healthy   Brief/Interim Summary: 41 y.o. male with no past medical history comes into the ED for right foot cellulitis that started 3 days prior to admission was seen in the ED during that time was started on doxycycline but he relates that his erythema and pain progressively got worse.  Discharge Diagnoses:  Principal Problem:   Cellulitis of right foot  Right foot cellulitis/tinea pedis: He was started empirically on vancomycin then transition to IV cefazolin he defervesced blood cultures remain negative till date he will continue Ancef for total of 10 days as an outpatient. He will also continue Diflucan for his tinea pedis as an outpatient.  Discharge Instructions  Discharge Instructions     Diet - low sodium heart healthy   Complete by: As directed    Increase activity slowly   Complete by: As directed    No wound care   Complete by: As directed       Allergies as of 11/29/2021   No Known Allergies      Medication List     STOP taking these medications    doxycycline 100 MG capsule Commonly known as: VIBRAMYCIN       TAKE these medications    cephALEXin 500 MG capsule Commonly known as: KEFLEX Take 1 capsule (500 mg total) by mouth every 6 (six) hours for 7 days.   clotrimazole 1 % cream Commonly known as: LOTRIMIN Apply to affected area 2 times daily   fluconazole 200 MG tablet Commonly known as: DIFLUCAN Take 1 tablet (200 mg total) by mouth daily for 5 days. Start taking on: November 30, 2021    GLUCOSAMINE-CHONDROITIN PO Take 1 capsule by mouth daily.   miconazole nitrate Powd Commonly known as: MICATIN Apply 1 application topically 3 (three) times daily for 5 days.   naproxen 500 MG tablet Commonly known as: NAPROSYN Take 1 tablet (500 mg total) by mouth 2 (two) times daily.        No Known Allergies  Consultations: None   Procedures/Studies: DG Knee Complete 4 Views Left  Result Date: 11/21/2021 CLINICAL DATA:  Anterior left knee pain for the past 2 weeks, worsening. No known injury. EXAM: LEFT KNEE - COMPLETE 4+ VIEW COMPARISON:  03/09/2020 FINDINGS: Mild-to-moderate posterior patellar spur formation in mild medial and lateral spur formation with mild progression. No fracture, dislocation or effusion seen. Anterior, superior patellar enthesophyte with mild progression. Stable endosteal sclerotic region in the posterior aspect of the distal femur. This has nonaggressive features. IMPRESSION: 1. No acute abnormality. 2. Mild tricompartmental degenerative changes with mild progression. Electronically Signed   By: Beckie SaltsSteven  Reid M.D.   On: 11/21/2021 13:36   DG Foot Complete Right  Result Date: 11/25/2021 CLINICAL DATA:  Right foot pain with swelling. History of arthritis. EXAM: RIGHT FOOT COMPLETE - 3+ VIEW COMPARISON:  01/12/2021. FINDINGS: There is no evidence of fracture or dislocation. Degenerative changes are present in the midfoot and first metatarsophalangeal joint. Soft tissues are unremarkable. IMPRESSION: 1. No acute osseous abnormality. 2. Degenerative changes in the midfoot and first metatarsophalangeal joint. Electronically Signed   By: Charlestine NightLaura  Taylor M.D.  On: 11/25/2021 02:49   (Echo, Carotid, EGD, Colonoscopy, ERCP)    Subjective:  No complaint Discharge Exam: Vitals:   11/29/21 0355 11/29/21 0857  BP: 119/70 111/64  Pulse: 72 70  Resp:  18  Temp: 98.3 F (36.8 C) 98.2 F (36.8 C)  SpO2: 99% 100%   Vitals:   11/28/21 2015 11/29/21 0000 11/29/21  0355 11/29/21 0857  BP: 108/65 117/72 119/70 111/64  Pulse: (!) 57 64 72 70  Resp: 16   18  Temp: 98.2 F (36.8 C) 98.3 F (36.8 C) 98.3 F (36.8 C) 98.2 F (36.8 C)  TempSrc: Oral Oral Oral Oral  SpO2: 99% 99% 99% 100%  Weight:   84.4 kg   Height:        General: Pt is alert, awake, not in acute distress Cardiovascular: RRR, S1/S2 +, no rubs, no gallops Respiratory: CTA bilaterally, no wheezing, no rhonchi Abdominal: Soft, NT, ND, bowel sounds + Extremities: no edema, no cyanosis    The results of significant diagnostics from this hospitalization (including imaging, microbiology, ancillary and laboratory) are listed below for reference.     Microbiology: Recent Results (from the past 240 hour(s))  Resp Panel by RT-PCR (Flu A&B, Covid) Nasopharyngeal Swab     Status: None   Collection Time: 11/28/21  2:20 AM   Specimen: Nasopharyngeal Swab; Nasopharyngeal(NP) swabs in vial transport medium  Result Value Ref Range Status   SARS Coronavirus 2 by RT PCR NEGATIVE NEGATIVE Final    Comment: (NOTE) SARS-CoV-2 target nucleic acids are NOT DETECTED.  The SARS-CoV-2 RNA is generally detectable in upper respiratory specimens during the acute phase of infection. The lowest concentration of SARS-CoV-2 viral copies this assay can detect is 138 copies/mL. A negative result does not preclude SARS-Cov-2 infection and should not be used as the sole basis for treatment or other patient management decisions. A negative result may occur with  improper specimen collection/handling, submission of specimen other than nasopharyngeal swab, presence of viral mutation(s) within the areas targeted by this assay, and inadequate number of viral copies(<138 copies/mL). A negative result must be combined with clinical observations, patient history, and epidemiological information. The expected result is Negative.  Fact Sheet for Patients:  BloggerCourse.com  Fact Sheet for  Healthcare Providers:  SeriousBroker.it  This test is no t yet approved or cleared by the [place] FDA and  has been authorized for detection and/or diagnosis of SARS-CoV-2 by FDA under an Emergency Use Authorization (EUA). This EUA will remain  in effect (meaning this test can be used) for the duration of the COVID-19 declaration under Section 564(b)(1) of the Act, 21 U.S.C.section 360bbb-3(b)(1), unless the authorization is terminated  or revoked sooner.       Influenza A by PCR NEGATIVE NEGATIVE Final   Influenza B by PCR NEGATIVE NEGATIVE Final    Comment: (NOTE) The Xpert Xpress SARS-CoV-2/FLU/RSV plus assay is intended as an aid in the diagnosis of influenza from Nasopharyngeal swab specimens and should not be used as a sole basis for treatment. Nasal washings and aspirates are unacceptable for Xpert Xpress SARS-CoV-2/FLU/RSV testing.  Fact Sheet for Patients: BloggerCourse.com  Fact Sheet for Healthcare Providers: SeriousBroker.it  This test is not yet approved or cleared by the [place] FDA and has been authorized for detection and/or diagnosis of SARS-CoV-2 by FDA under an Emergency Use Authorization (EUA). This EUA will remain in effect (meaning this test can be used) for the duration of the COVID-19 declaration under Section 564(b)(1) of the  Act, 21 U.S.C. section 360bbb-3(b)(1), unless the authorization is terminated or revoked.  Performed at [facility], [address], [place], [place] [address]      Labs: BNP (last 3 results) No results for input(s): BNP in the last 8760 hours. Basic Metabolic Panel: Recent Labs  Lab 11/28/21 0045  NA 141  K 4.3  CL 108  CO2 26  GLUCOSE 110*  BUN 19  CREATININE 0.99  CALCIUM 9.2   Liver Function Tests: No results for input(s): AST, ALT, ALKPHOS, BILITOT, PROT, ALBUMIN in the last 168 hours. No results for input(s):  LIPASE, AMYLASE in the last 168 hours. No results for input(s): AMMONIA in the last 168 hours. CBC: Recent Labs  Lab 11/28/21 0045  WBC 12.1*  NEUTROABS 7.2  HGB 12.0*  HCT 37.2*  MCV 92.8  PLT 196   Cardiac Enzymes: No results for input(s): CKTOTAL, CKMB, CKMBINDEX, TROPONINI in the last 168 hours. BNP: Invalid input(s): POCBNP CBG: No results for input(s): GLUCAP in the last 168 hours. D-Dimer No results for input(s): DDIMER in the last 72 hours. Hgb A1c No results for input(s): HGBA1C in the last 72 hours. Lipid Profile No results for input(s): CHOL, HDL, LDLCALC, TRIG, CHOLHDL, LDLDIRECT in the last 72 hours. Thyroid function studies No results for input(s): TSH, T4TOTAL, T3FREE, THYROIDAB in the last 72 hours.  Invalid input(s): FREET3 Anemia work up No results for input(s): VITAMINB12, FOLATE, FERRITIN, TIBC, IRON, RETICCTPCT in the last 72 hours. Urinalysis    Component Value Date/Time   COLORURINE YELLOW 01/02/2014 1215   APPEARANCEUR CLOUDY (A) 01/02/2014 1215   LABSPEC 1.024 01/02/2014 1215   PHURINE 5.5 01/02/2014 1215   GLUCOSEU NEGATIVE 01/02/2014 1215   HGBUR LARGE (A) 01/02/2014 1215   BILIRUBINUR NEGATIVE 01/02/2014 1215   KETONESUR NEGATIVE 01/02/2014 1215   PROTEINUR NEGATIVE 01/02/2014 1215   UROBILINOGEN 0.2 01/02/2014 1215   NITRITE NEGATIVE 01/02/2014 1215   LEUKOCYTESUR LARGE (A) 01/02/2014 1215   Sepsis Labs Invalid input(s): PROCALCITONIN,  WBC,  LACTICIDVEN Microbiology Recent Results (from the past 240 hour(s))  Resp Panel by RT-PCR (Flu A&B, Covid) Nasopharyngeal Swab     Status: None   Collection Time: 11/28/21  2:20 AM   Specimen: Nasopharyngeal Swab; Nasopharyngeal(NP) swabs in vial transport medium  Result Value Ref Range Status   SARS Coronavirus 2 by RT PCR NEGATIVE NEGATIVE Final    Comment: (NOTE) SARS-CoV-2 target nucleic acids are NOT DETECTED.  The SARS-CoV-2 RNA is generally detectable in upper respiratory specimens  during the acute phase of infection. The lowest concentration of SARS-CoV-2 viral copies this assay can detect is 138 copies/mL. A negative result does not preclude SARS-Cov-2 infection and should not be used as the sole basis for treatment or other patient management decisions. A negative result may occur with  improper specimen collection/handling, submission of specimen other than nasopharyngeal swab, presence of viral mutation(s) within the areas targeted by this assay, and inadequate number of viral copies(<138 copies/mL). A negative result must be combined with clinical observations, patient history, and epidemiological information. The expected result is Negative.  Fact Sheet for Patients:  BloggerCourse.com  Fact Sheet for Healthcare Providers:  SeriousBroker.it  This test is no t yet approved or cleared by the [place] FDA and  has been authorized for detection and/or diagnosis of SARS-CoV-2 by FDA under an Emergency Use Authorization (EUA). This EUA will remain  in effect (meaning this test can be used) for the duration of the COVID-19 declaration  under Section 564(b)(1) of the Act, 21 U.S.C.section 360bbb-3(b)(1), unless the authorization is terminated  or revoked sooner.       Influenza A by PCR NEGATIVE NEGATIVE Final   Influenza B by PCR NEGATIVE NEGATIVE Final    Comment: (NOTE) The Xpert Xpress SARS-CoV-2/FLU/RSV plus assay is intended as an aid in the diagnosis of influenza from Nasopharyngeal swab specimens and should not be used as a sole basis for treatment. Nasal washings and aspirates are unacceptable for Xpert Xpress SARS-CoV-2/FLU/RSV testing.  Fact Sheet for Patients: BloggerCourse.com  Fact Sheet for Healthcare Providers: SeriousBroker.it  This test is not yet approved or cleared by the [place] FDA and has been authorized for detection  and/or diagnosis of SARS-CoV-2 by FDA under an Emergency Use Authorization (EUA). This EUA will remain in effect (meaning this test can be used) for the duration of the COVID-19 declaration under Section 564(b)(1) of the Act, 21 U.S.C. section 360bbb-3(b)(1), unless the authorization is terminated or revoked.  Performed at [facility], [address] [place], [place] [address]     SIGNED:   Marinda Elk, MD  [facility] 11/29/2021, 11:10 AM Pager   If 7PM-7AM, please contact night-coverage [email or web address] Password [email or web address]

## 2021-11-29 NOTE — Discharge Instructions (Signed)
Franklin Love was admitted to the Hospital on 11/27/2021 and Discharged on Discharge Date 11/29/2021 and should be excused from work/school   for 7   days starting 11/27/2021 , may return to work/school without any restrictions.  Call Bess Harvest MD, [facility] [phone] with questions.  Charlynne Cousins M.D on 11/29/2021,at 11:09 AM  [facility] Office  [phone]

## 2021-11-29 NOTE — Progress Notes (Signed)
Some difficulties with discharge due to patient wanting to be contacted by MD to discuss pain control at home.  MD refused to contact patient after requesting him to contact patient and providing him with contact number to speak to patient. Escalated situation to charge Runner, broadcasting/film/video. Patient then informed me he refused to leave until being able to discuss pain management at home with MD. Escalated to [facility], patient agreed to discharge after speaking with [facility] Iv removed, discharge papers reviewed. Patient discharged

## 2021-12-26 ENCOUNTER — Encounter (HOSPITAL_COMMUNITY): Payer: Self-pay

## 2021-12-26 ENCOUNTER — Emergency Department (HOSPITAL_COMMUNITY): Payer: Medicaid Other

## 2021-12-26 ENCOUNTER — Emergency Department (HOSPITAL_COMMUNITY)
Admission: EM | Admit: 2021-12-26 | Discharge: 2021-12-26 | Disposition: A | Discharge status: Home / Self Care | Payer: Medicaid Other | Attending: Emergency Medicine | Admitting: Emergency Medicine

## 2021-12-26 DIAGNOSIS — L03115 Cellulitis of right lower limb: Secondary | ICD-10-CM | POA: Insufficient documentation

## 2021-12-26 DIAGNOSIS — B353 Tinea pedis: Secondary | ICD-10-CM | POA: Insufficient documentation

## 2021-12-26 DIAGNOSIS — F17210 Nicotine dependence, cigarettes, uncomplicated: Secondary | ICD-10-CM | POA: Insufficient documentation

## 2021-12-26 LAB — COMPREHENSIVE METABOLIC PANEL
ALT: 19 U/L (ref 0–44)
AST: 24 U/L (ref 15–41)
Albumin: 4.1 g/dL (ref 3.5–5.0)
Alkaline Phosphatase: 74 U/L (ref 38–126)
Anion gap: 9 (ref 5–15)
BUN: 21 mg/dL — ABNORMAL HIGH (ref 6–20)
CO2: 25 mmol/L (ref 22–32)
Calcium: 9.4 mg/dL (ref 8.9–10.3)
Chloride: 105 mmol/L (ref 98–111)
Creatinine, Ser: 1.11 mg/dL (ref 0.61–1.24)
GFR, Estimated: >60 mL/min (ref >60)
Glucose, Bld: 152 mg/dL — ABNORMAL HIGH (ref 70–99)
Potassium: 4.3 mmol/L (ref 3.5–5.1)
Sodium: 139 mmol/L (ref 135–145)
Total Bilirubin: 0.2 mg/dL — ABNORMAL LOW (ref 0.3–1.2)
Total Protein: 7 g/dL (ref 6.5–8.1)

## 2021-12-26 LAB — CBC WITH DIFFERENTIAL/PLATELET
Abs Immature Granulocytes: 0.03 10*3/uL (ref 0.00–0.07)
Basophils Absolute: 0 10*3/uL (ref 0.0–0.1)
Basophils Relative: 0 %
Eosinophils Absolute: 0.1 10*3/uL (ref 0.0–0.5)
Eosinophils Relative: 2 %
HCT: 39.1 % (ref 39.0–52.0)
Hemoglobin: 12.2 g/dL — ABNORMAL LOW (ref 13.0–17.0)
Immature Granulocytes: 0 %
Lymphocytes Relative: 32 %
Lymphs Abs: 2.3 10*3/uL (ref 0.7–4.0)
MCH: 29.7 pg (ref 26.0–34.0)
MCHC: 31.2 g/dL (ref 30.0–36.0)
MCV: 95.1 fL (ref 80.0–100.0)
Monocytes Absolute: 0.8 10*3/uL (ref 0.1–1.0)
Monocytes Relative: 12 %
Neutro Abs: 3.9 10*3/uL (ref 1.7–7.7)
Neutrophils Relative %: 54 %
Platelets: 229 10*3/uL (ref 150–400)
RBC: 4.11 MIL/uL — ABNORMAL LOW (ref 4.22–5.81)
RDW: 12.1 % (ref 11.5–15.5)
WBC: 7.3 10*3/uL (ref 4.0–10.5)
nRBC: 0 % (ref 0.0–0.2)

## 2021-12-26 LAB — LACTIC ACID, PLASMA: Lactic Acid, Venous: 1.6 mmol/L (ref 0.5–1.9)

## 2021-12-26 MED ORDER — DOXYCYCLINE HYCLATE 100 MG PO CAPS: 100.0000 mg | ORAL_CAPSULE | Freq: Two times a day (BID) | ORAL | 0 refills | Status: AC

## 2021-12-26 MED ORDER — FLUCONAZOLE 150 MG PO TABS: 150.0000 mg | ORAL_TABLET | ORAL | 0 refills | Status: AC

## 2021-12-26 NOTE — Discharge Instructions (Addendum)
We will discharge you with doxycycline to treat for bacterial infection of the foot (cellulitis).  You should take this medicine twice a day. ? ?We will discharge you with Diflucan to treat fungal infection of the foot.  You should take this ONCE PER WEEK for 2 weeks. (For example you can take 1 dose today, and your 2nd dose NEXT Monday) ? ?We recommend following up with podiatry.  You may do this by calling [phone] to set up an appointment.  If for what ever reason you do not follow-up with podiatry, please follow-up with your primary care doctor. ? ? ?

## 2021-12-26 NOTE — ED Notes (Signed)
Wallet locked up in security [phone] ?

## 2021-12-26 NOTE — ED Notes (Signed)
Sterile dressing applied

## 2021-12-26 NOTE — ED Notes (Signed)
Patient states he has already urinated  and cant go at present will continue to try and get spec.  ?

## 2021-12-26 NOTE — ED Triage Notes (Signed)
Pt arrives POV for eval of R sided foot pain. Pt was here approx 1 mos ago w similar complaints. Reports that he was started on abx and antifungal but reports ongoing pain and purulent DC. Reports no hx of diabetes. Denies fevers/chills. ?

## 2021-12-26 NOTE — ED Provider Notes (Signed)
?[facility] EMERGENCY DEPARTMENT ?Provider Note ? ?History  ? ?Chief Complaint  ?Patient presents with  ? Foot Pain  ? ?Franklin Love is a 41 y.o. male w/ recent admission for R foot cellulitis and R tinea pedis who p/w c/f persistent R foot pain.  ? ?The history is provided by the patient.  ?Illness ?Location:  R anterior surface of foot, see photo below ?Quality:  TTP ?Severity:  Moderate ?Onset quality:  Gradual ?Duration:  1 month ?Timing:  Constant ?Progression:  Improving ?Chronicity:  New ?Context:  See MDM ?Associated symptoms: no abdominal pain, no chest pain, no congestion, no cough, no diarrhea, no fever, no headaches, no nausea, no rash, no shortness of breath and no sore throat   ? ? ?History reviewed. No pertinent past medical history. ? ?Social History  ? ?Tobacco Use  ? Smoking status: Every Day  ?  Packs/day: 0.50  ?  Types: Cigarettes  ? Smokeless tobacco: Never  ?Substance Use Topics  ? Alcohol use: Yes  ?  Comment: "2 or 3 shots every couple days":  ? Drug use: No  ?  ? ?History reviewed. No pertinent family history. ? ?Review of Systems  ?Constitutional:  Negative for chills and fever.  ?HENT:  Negative for congestion and sore throat.   ?Eyes:  Negative for photophobia and visual disturbance.  ?Respiratory:  Negative for cough and shortness of breath.   ?Cardiovascular:  Negative for chest pain and palpitations.  ?Gastrointestinal:  Negative for abdominal pain, blood in stool, diarrhea and nausea.  ?Endocrine: Negative.   ?Genitourinary:  Negative for dysuria and hematuria.  ?Musculoskeletal:  Negative for neck pain and neck stiffness.  ?Skin:  Positive for wound. Negative for rash.  ?Allergic/Immunologic: Negative.   ?Neurological:  Negative for syncope and headaches.  ?Hematological: Negative.   ?Psychiatric/Behavioral: Negative.    ? ? ?Physical Exam  ? ?Today's Vitals  ? 12/26/21 1027 12/26/21 0801 12/26/21 2536  ?BP: (!) 137/111  (!) 160/90  ?Pulse: 77  61  ?Resp: 16     ?Temp: 98.8 ?F (37.1 ?C)    ?TempSrc: Oral    ?SpO2: 98%  99%  ?Weight:  85 kg   ?Height:  5\' 7"  (1.702 m)   ?PainSc:  7    ?  ? ?Physical Exam ?Vitals reviewed.  ?Constitutional:   ?   General: He is not in acute distress. ?   Appearance: Normal appearance.  ?HENT:  ?   Head: Normocephalic and atraumatic.  ?   Nose: Nose normal. No congestion.  ?   Mouth/Throat:  ?   Mouth: Mucous membranes are moist.  ?   Pharynx: Oropharynx is clear. No oropharyngeal exudate.  ?Eyes:  ?   Extraocular Movements: Extraocular movements intact.  ?   Pupils: Pupils are equal, round, and reactive to light.  ?Cardiovascular:  ?   Rate and Rhythm: Normal rate and regular rhythm.  ?   Pulses: Normal pulses.  ?   Heart sounds: Normal heart sounds. No murmur heard. ?Pulmonary:  ?   Effort: Pulmonary effort is normal. No respiratory distress.  ?   Breath sounds: Normal breath sounds. No stridor. No wheezing, rhonchi or rales.  ?Abdominal:  ?   General: Abdomen is flat.  ?   Palpations: Abdomen is soft.  ?   Tenderness: There is no abdominal tenderness. There is no right CVA tenderness, left CVA tenderness, guarding or rebound.  ?Musculoskeletal:     ?   General: Normal range  of motion.  ?   Cervical back: Normal range of motion and neck supple.  ?   Right lower leg: No edema.  ?   Left lower leg: No edema.  ?     Feet: ? ?Skin: ?   General: Skin is warm and dry.  ?   Capillary Refill: Capillary refill takes less than 2 seconds.  ?   Comments: Wound on R foot as below in photo. No warmth, some induration, no purulent drainage expressible from wound  ?Neurological:  ?   General: No focal deficit present.  ?   Mental Status: He is alert and oriented to person, place, and time. Mental status is at baseline.  ?   Cranial Nerves: No cranial nerve deficit.  ?   Sensory: No sensory deficit.  ?   Motor: No weakness.  ?   Coordination: Coordination normal.  ?Psychiatric:     ?   Mood and Affect: Mood normal.     ?   Behavior: Behavior normal.   ? ? ? ? ? ?ED Course  ?Procedures ? ?Medical Decision Making:  ?Franklin Love is a 41 y.o. male w/ recent admission for R foot cellulitis and R tinea pedis who p/w c/f persistent R foot pain.  ? ?Recent admission on 2/6-7 for right foot cellulitis/tinea pedis.  He was initially started empirically on vancomycin and then transition to IV cefazolin, discharged on Ancef x10 days Diflucan as an outpatient ? ?P/w c/f "it never fully healed and while its the best my foot has ever looked since the start of this, I still have foot pain and am concerned it hasn't healed." ? ?Exam as above. No systemic sxs.  ? ?ER provider interpretation of Imaging / Radiology:  ?Not indicated ? ?ER provider interpretation of EKG:  ?Not indicated ? ?ER provider interpretation of Labs:  ?Not indicated ? ?Key medications administered in the ER:  ?Medications - No data to display ? ?Diagnoses considered: C/f R foot cellulitis and tinea pedis. No c/f abscess. No c/f systemic infection. No report of trauma.   ? ?Consulted: None ? ?Discharge with doxycycline for right foot cellulitis ?Discharge with Diflucan for right foot tinea pedis ?Follow-up with podiatry (information given in AVS) ?All questions answered at the time of discharge, patient discharged in stable condition ? ?Patient seen in conjunction with Dr. Lynelle Doctor ? ?Dragon medical dictation software was used in the creation of this note.  ? ?Electronically signed by: Drake Leach, MD on 12/26/2021 at 9:56 AM ? ?Clinical Impression:  ?1. Cellulitis of right foot   ?2. Tinea pedis of right foot   ? ? ?Dispo: Discharge ? ?  ?Drake Leach, MD ?12/26/21 2109 ? ?  ?Linwood Dibbles, MD ?12/27/21 1605 ? ?

## 2021-12-29 ENCOUNTER — Ambulatory Visit: Payer: Self-pay | Admitting: *Deleted

## 2021-12-29 NOTE — Telephone Encounter (Signed)
Summary: possible covid  ? Pt called saying his sin tested positive for covid yesterday and now he is feeling bad.  He does not have a primary doctor.     ?  ? ?Chief Complaint: exposed to Covid ?Symptoms: maybe ached this morning ?Frequency: this morning once ?Pertinent Negatives: Patient denies fever ?Disposition: [] ED /[] Urgent Care (no appt availability in office) / [] Appointment(In office/virtual)/ []  [facility]/ [] Home Care/ [] Refused Recommended Disposition /[x] [facility]/ []  Follow-up with PCP ?Additional Notes: Son is positive. Pt just wants testing. Pt does not have a PCP. Explained there are tests kits at all pharmacies and the pharmacy can do testing. Chart states he has Medicaid [place]. Also notified of [facility] since he does not have a PCP. ? ?Reason for Disposition ? COVID-19 Testing, questions about ? ?Answer Assessment - Initial Assessment Questions ?1. COVID-19 EXPOSURE: "Please describe how you were exposed to someone with a COVID-19 infection." ?    Son has it ?2. PLACE of CONTACT: "Where were you when you were exposed to COVID-19?" (e.g., home, school, medical waiting room; which city?) ?    home ?3. TYPE of CONTACT: "How much contact was there?" (e.g., sitting next to, live in same house, work in same office, same building) ?    close ?4. DURATION of CONTACT: "How long were you in contact with the COVID-19 patient?" (e.g., a few seconds, passed by person, a few minutes, 15 minutes or longer, live with the patient) ?    A long time, live together ?5. MASK: "Were you wearing a mask?" "Was the other person wearing a mask?" Note: wearing a mask reduces the risk of an otherwise close contact. ?    no ?6. DATE of CONTACT: "When did you have contact with a COVID-19 patient?" (e.g., how many days ago) ?    This week ?7. COMMUNITY SPREAD: "Are there lots of cases of COVID-19 (community spread) where you live?" (See public health department website, if unsure)   ?    no ?8.  SYMPTOMS: "Do you have any symptoms?" (e.g., fever, cough, breathing difficulty, loss of taste or smell) ?    Feels aching ?11. PREGNANCY OR POSTPARTUM: "Is there any chance you are pregnant?" "When was your last menstrual period?" "Did you deliver in the last 2 weeks?" ?      na ? ?Protocols used: Coronavirus ([id number]) Exposure-A-AH ? ?
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
## 2022-06-15 ENCOUNTER — Emergency Department (HOSPITAL_COMMUNITY): Payer: Self-pay

## 2022-06-15 ENCOUNTER — Emergency Department (HOSPITAL_COMMUNITY)
Admission: EM | Admit: 2022-06-15 | Discharge: 2022-06-16 | Disposition: A | Discharge status: Home / Self Care | Payer: Self-pay | Attending: Emergency Medicine | Admitting: Emergency Medicine

## 2022-06-15 ENCOUNTER — Encounter (HOSPITAL_COMMUNITY): Payer: Self-pay | Admitting: Emergency Medicine

## 2022-06-15 ENCOUNTER — Other Ambulatory Visit: Payer: Self-pay

## 2022-06-15 DIAGNOSIS — Z20822 Contact with and (suspected) exposure to covid-19: Secondary | ICD-10-CM | POA: Insufficient documentation

## 2022-06-15 DIAGNOSIS — R531 Weakness: Secondary | ICD-10-CM | POA: Insufficient documentation

## 2022-06-15 DIAGNOSIS — R29898 Other symptoms and signs involving the musculoskeletal system: Secondary | ICD-10-CM

## 2022-06-15 DIAGNOSIS — R110 Nausea: Secondary | ICD-10-CM | POA: Insufficient documentation

## 2022-06-15 LAB — BASIC METABOLIC PANEL
Anion gap: 9 (ref 5–15)
BUN: 16 mg/dL (ref 6–20)
CO2: 26 mmol/L (ref 22–32)
Calcium: 9.4 mg/dL (ref 8.9–10.3)
Chloride: 106 mmol/L (ref 98–111)
Creatinine, Ser: 1.2 mg/dL (ref 0.61–1.24)
GFR, Estimated: >60 mL/min (ref >60)
Glucose, Bld: 85 mg/dL (ref 70–99)
Potassium: 4.5 mmol/L (ref 3.5–5.1)
Sodium: 141 mmol/L (ref 135–145)

## 2022-06-15 LAB — CBC
HCT: 40.3 % (ref 39.0–52.0)
Hemoglobin: 12.8 g/dL — ABNORMAL LOW (ref 13.0–17.0)
MCH: 30.1 pg (ref 26.0–34.0)
MCHC: 31.8 g/dL (ref 30.0–36.0)
MCV: 94.8 fL (ref 80.0–100.0)
Platelets: 176 10*3/uL (ref 150–400)
RBC: 4.25 MIL/uL (ref 4.22–5.81)
RDW: 12.2 % (ref 11.5–15.5)
WBC: 7.2 10*3/uL (ref 4.0–10.5)
nRBC: 0 % (ref 0.0–0.2)

## 2022-06-15 LAB — URINALYSIS, ROUTINE W REFLEX MICROSCOPIC
Bilirubin Urine: NEGATIVE
Glucose, UA: NEGATIVE mg/dL
Hgb urine dipstick: NEGATIVE
Ketones, ur: NEGATIVE mg/dL
Leukocytes,Ua: NEGATIVE
Nitrite: NEGATIVE
Protein, ur: NEGATIVE mg/dL
Specific Gravity, Urine: 1.029 (ref 1.005–1.030)
pH: 5 (ref 5.0–8.0)

## 2022-06-15 LAB — CBG MONITORING, ED: Glucose-Capillary: 86 mg/dL (ref 70–99)

## 2022-06-15 LAB — MAGNESIUM: Magnesium: 2 mg/dL (ref 1.7–2.4)

## 2022-06-15 LAB — TROPONIN I (HIGH SENSITIVITY): Troponin I (High Sensitivity): 5 ng/L (ref <18)

## 2022-06-15 NOTE — ED Notes (Signed)
Pt called 2x 

## 2022-06-15 NOTE — ED Notes (Signed)
Patient transported to CT 

## 2022-06-15 NOTE — ED Triage Notes (Signed)
Pt with all over weakness and lethargy with nausea since yesterday  Feels like "I just am gonna lay down"  Pt says this started while at work yesterday  Drives a Physicist, medical at [employer].  No recent illness or injury.

## 2022-06-15 NOTE — ED Provider Notes (Signed)
[facility] EMERGENCY DEPARTMENT Provider Note   CSN: [id number] Arrival date & time: 06/15/22  1307     History  Chief Complaint  Patient presents with   Weakness    Franklin Love is a 41 y.o. male. With no significant past medical history who presents to the emergency department with multiple complaints.  States that yesterday, after he got to work, he started to feel weak in his lower extremities. States that after the weakness began he had abdominal pain and felt nauseated. Denies vomiting or diarrhea. Denies urinary symptoms. States that symptoms went away. Today had similar symptoms of weakness after getting to work and then had chest pain. Describes chest pain and sharp and quick in his left chest, non-radiating. Associated with shortness of breath. Denies palpitations, syncope. Denies cough or fever. Denies recent low back pain, trauma to the back, falls. He does do heavy lifting at work but does not remember inciting event. Denies saddle anesthesia, bowel/bladder incontinence/retention, numbness or tingling to his legs, arms or face. No recent illnesses or travel.    Weakness Associated symptoms: abdominal pain, chest pain, nausea and shortness of breath   Associated symptoms: no vomiting        Home Medications Prior to Admission medications   Medication Sig Start Date End Date Taking? Authorizing Provider  clotrimazole (LOTRIMIN) 1 % cream Apply to affected area 2 times daily Patient not taking: Reported on 12/26/2021 11/25/21   Couture, Cortni S, PA-C  diclofenac Sodium (VOLTAREN) 1 % GEL Apply 2 g topically 2 (two) times daily as needed (pain).    [provider]  GLUCOSAMINE-CHONDROITIN PO Take 1 capsule by mouth daily.    [provider]  ibuprofen (ADVIL) 200 MG tablet Take 600 mg by mouth every 6 (six) hours as needed for mild pain.    [provider]  naproxen (NAPROSYN) 500 MG tablet Take 1 tablet (500 mg total) by mouth  2 (two) times daily. Patient not taking: Reported on 12/26/2021 11/21/21   Camila Li, MD      Allergies    Patient has no known allergies.    Review of Systems   Review of Systems  Respiratory:  Positive for shortness of breath.   Cardiovascular:  Positive for chest pain.  Gastrointestinal:  Positive for abdominal pain and nausea. Negative for vomiting.  Neurological:  Positive for weakness.  All other systems reviewed and are negative.   Physical Exam Updated Vital Signs BP (!) 131/97   Pulse (!) 58   Temp 98.2 F (36.8 C) (Oral)   Resp 16   SpO2 98%  Physical Exam Vitals and nursing note reviewed.  Constitutional:      General: He is not in acute distress.    Appearance: Normal appearance. He is not ill-appearing or toxic-appearing.  HENT:     Head: Normocephalic and atraumatic.     Nose: Nose normal.     Mouth/Throat:     Mouth: Mucous membranes are moist.     Pharynx: Oropharynx is clear.  Eyes:     General: No scleral icterus.    Extraocular Movements: Extraocular movements intact.     Pupils: Pupils are equal, round, and reactive to light.  Cardiovascular:     Rate and Rhythm: Normal rate and regular rhythm.     Pulses: Normal pulses.     Heart sounds: No murmur heard. Pulmonary:     Effort: Pulmonary effort is normal. No respiratory distress.  Breath sounds: Normal breath sounds.  Abdominal:     General: Bowel sounds are normal. There is no distension.     Palpations: Abdomen is soft.     Tenderness: There is no abdominal tenderness.  Musculoskeletal:        General: Normal range of motion.     Cervical back: Normal range of motion and neck supple. No tenderness.  Skin:    General: Skin is warm and dry.     Capillary Refill: Capillary refill takes less than 2 seconds.  Neurological:     General: No focal deficit present.     Mental Status: He is alert and oriented to person, place, and time.     Cranial Nerves: No cranial nerve deficit.      Sensory: No sensory deficit.     Motor: Weakness present.     Comments: Strength 4/5 BLE  Psychiatric:        Mood and Affect: Mood normal.        Behavior: Behavior normal.        Thought Content: Thought content normal.        Judgment: Judgment normal.    ED Results / Procedures / Treatments   Labs (all labs ordered are listed, but only abnormal results are displayed) Labs Reviewed  CBC - Abnormal; Notable for the following components:      Result Value   Hemoglobin 12.8 (*)    All other components within normal limits  RESP PANEL BY RT-PCR (FLU A&B, COVID) ARPGX2  BASIC METABOLIC PANEL  URINALYSIS, ROUTINE W REFLEX MICROSCOPIC  MAGNESIUM  CBG MONITORING, ED  TROPONIN I (HIGH SENSITIVITY)  TROPONIN I (HIGH SENSITIVITY)   EKG None  Radiology No results found.  Procedures Procedures   Medications Ordered in ED Medications - No data to display  ED Course/ Medical Decision Making/ A&P                           Medical Decision Making Amount and/or Complexity of Data Reviewed Labs: ordered. Radiology: ordered.  Care of patient being handed off to [place], [place]. Currently patient is pending completed lab work up and CT lumbar spine. Unclear etiology of his symptoms. If the remainder of his labs are unremarkable, as well as normal CT, feel that he can be discharged with PCP follow-up and return precautions. He does only sleep 3 hours a day since starting a new job about 3 months ago. ?Exhaustion. He has been ambulatory without assistance here in ED.  Final disposition pending complete work up.  Final Clinical Impression(s) / ED Diagnoses Final diagnoses:  None    Rx / DC Orders ED Discharge Orders     None         Cristopher Peru, PA-C 06/16/22 0020    Sloan Leiter, DO 06/23/22 [phone]

## 2022-06-15 NOTE — ED Notes (Signed)
Pt refused labs until he gets to the back

## 2022-06-15 NOTE — ED Provider Triage Note (Signed)
Emergency Medicine Provider Triage Evaluation Note  Franklin Love , a 41 y.o. male  was evaluated in triage.  Pt complains of weakness. Report feeling dizzy and weak x 2 days.  Endorse nausea, queasiness, and vague abdominal and chest discomfort.  No hx of DM, no alcohol or tobacco abuse.  No recent sick contact, no fever, cough, sob  Review of Systems  Positive: As above Negative: As above  Physical Exam  BP 115/63 (BP Location: Right Arm)   Pulse 66   Temp 98.1 F (36.7 C) (Oral)   Resp 16   SpO2 98%  Gen:   Awake, no distress   Resp:  Normal effort  MSK:   Moves extremities without difficulty  Other:    Medical Decision Making  Medically screening exam initiated at 1:29 PM.  Appropriate orders placed.  Tedford Riecke was informed that the remainder of the evaluation will be completed by another provider, this initial triage assessment does not replace that evaluation, and the importance of remaining in the ED until their evaluation is complete.     Fayrene Helper, PA-C 06/15/22 1335

## 2022-06-15 NOTE — ED Notes (Signed)
Patient refused bloodwork per triage

## 2022-06-16 LAB — RESP PANEL BY RT-PCR (FLU A&B, COVID) ARPGX2
Influenza A by PCR: NEGATIVE
Influenza B by PCR: NEGATIVE
SARS Coronavirus 2 by RT PCR: NEGATIVE

## 2022-06-16 LAB — TROPONIN I (HIGH SENSITIVITY): Troponin I (High Sensitivity): 4 ng/L (ref <18)

## 2022-06-16 MED ORDER — METHYLPREDNISOLONE 4 MG PO TBPK: ORAL_TABLET | ORAL | 0 refills | Status: DC

## 2022-06-16 MED ORDER — CELECOXIB 200 MG PO CAPS: 200.0000 mg | ORAL_CAPSULE | Freq: Two times a day (BID) | ORAL | 0 refills | Status: DC

## 2022-06-16 NOTE — ED Notes (Signed)
RN reviewed discharge instructions with pt. Pt verbalized understanding and had no further questions. VSS upon dishcarge

## 2022-06-16 NOTE — ED Notes (Signed)
Pt ambulates to and from bathroom with steady gait

## 2022-06-16 NOTE — Discharge Instructions (Addendum)
SEEK IMMEDIATE MEDICAL ATTENTION IF: New numbness, tingling, weakness, or problem with the use of your arms or legs.  Severe back pain not relieved with medications.  Change in bowel or bladder control.  Increasing pain in any areas of the body (such as chest or abdominal pain).  Shortness of breath, dizziness or fainting.  Nausea (feeling sick to your stomach), vomiting, fever, or sweats.  

## 2022-06-16 NOTE — ED Provider Notes (Signed)
Patient had 2 episodes of weakness in legs  today and yesterday.  Plan CT / ambulate.  Physical Exam  BP (!) 131/97   Pulse (!) 58   Temp 98.2 F (36.8 C) (Oral)   Resp 16   SpO2 98%   Physical Exam Equal lower extremity strength 5 out of 5 at this time.  No clonus at the ankle, 1+ patellar reflex bilaterally Procedures  Procedures  ED Course / MDM    Medical Decision Making Amount and/or Complexity of Data Reviewed Labs: ordered. Radiology: ordered.   Patient does heavy lifting.  Was having some weakness in his legs earlier which has resolved.  He has been ambulatory here.  I visualized and interpreted CT lumbar spine Which shows no acute abnormalities.  Will discharge with Medrol and anti-inflammatories.  Follow-up with outpatient PCP if condition is worsening.      Arthor Captain, PA-C 06/16/22 0232    Nira Conn, MD 06/16/22 [phone]

## 2022-10-12 ENCOUNTER — Encounter (HOSPITAL_BASED_OUTPATIENT_CLINIC_OR_DEPARTMENT_OTHER): Payer: Self-pay | Admitting: Emergency Medicine

## 2022-10-12 ENCOUNTER — Other Ambulatory Visit: Payer: Self-pay

## 2022-10-12 ENCOUNTER — Emergency Department (HOSPITAL_BASED_OUTPATIENT_CLINIC_OR_DEPARTMENT_OTHER)
Admission: EM | Admit: 2022-10-12 | Discharge: 2022-10-12 | Disposition: A | Discharge status: Home / Self Care | Payer: Commercial Managed Care - HMO | Attending: Emergency Medicine | Admitting: Emergency Medicine

## 2022-10-12 DIAGNOSIS — L03115 Cellulitis of right lower limb: Secondary | ICD-10-CM | POA: Diagnosis not present

## 2022-10-12 DIAGNOSIS — M79661 Pain in right lower leg: Secondary | ICD-10-CM | POA: Diagnosis present

## 2022-10-12 MED ORDER — DOXYCYCLINE HYCLATE 100 MG PO CAPS: 100.0000 mg | ORAL_CAPSULE | Freq: Two times a day (BID) | ORAL | 0 refills | Status: DC

## 2022-10-12 MED ORDER — DOXYCYCLINE HYCLATE 100 MG PO TABS
100.0000 mg | ORAL_TABLET | Freq: Once | ORAL | Status: AC
  Administered 2022-10-12: 100 mg via ORAL
  Filled 2022-10-12: qty 1

## 2022-10-12 NOTE — ED Provider Notes (Signed)
[facility] EMERGENCY DEPT Provider Note   CSN: [id number] Arrival date & time: 10/12/22  [id number]     History  Chief Complaint  Patient presents with   Leg Pain    Franklin Love is a 41 y.o. male.  Patient is a 41 year old male presenting with complaints of right lower leg pain.  This has been worsening over the past 2 days and began in the absence of any injury or trauma.  He denies any fevers or chills.  He denies any chest pain or shortness of breath.  Pain is worse with ambulation and palpation with no alleviating factors.  The history is provided by the patient.       Home Medications Prior to Admission medications   Medication Sig Start Date End Date Taking? Authorizing Provider  celecoxib (CELEBREX) 200 MG capsule Take 1 capsule (200 mg total) by mouth 2 (two) times daily. 06/16/22   Margarita Mail, PA-C  clotrimazole (LOTRIMIN) 1 % cream Apply to affected area 2 times daily Patient not taking: Reported on 12/26/2021 11/25/21   Couture, Cortni S, PA-C  diclofenac Sodium (VOLTAREN) 1 % GEL Apply 2 g topically 2 (two) times daily as needed (pain).    [provider]  GLUCOSAMINE-CHONDROITIN PO Take 1 capsule by mouth daily.    [provider]  ibuprofen (ADVIL) 200 MG tablet Take 600 mg by mouth every 6 (six) hours as needed for mild pain.    [provider]  methylPREDNISolone (MEDROL DOSEPAK) 4 MG TBPK tablet Use as directed 06/16/22   Margarita Mail, PA-C  naproxen (NAPROSYN) 500 MG tablet Take 1 tablet (500 mg total) by mouth 2 (two) times daily. Patient not taking: Reported on 12/26/2021 11/21/21   Zachery Dakins, MD      Allergies    Patient has no known allergies.    Review of Systems   Review of Systems  All other systems reviewed and are negative.   Physical Exam Updated Vital Signs BP 133/77 (BP Location: Left Arm)   Pulse 76   Temp 98.4 F (36.9 C) (Oral)   Resp 17   Ht 5\' 7"  (1.702 m)   Wt 84.8 kg   SpO2 100%    BMI 29.29 kg/m  Physical Exam Vitals and nursing note reviewed.  Constitutional:      General: He is not in acute distress.    Appearance: Normal appearance. He is not ill-appearing.  HENT:     Head: Normocephalic and atraumatic.  Pulmonary:     Effort: Pulmonary effort is normal.  Musculoskeletal:     Comments: The lateral aspect of the right lower leg is noted to have warmth, erythema, and tenderness to palpation.  There is no calf tenderness.  DP pulses are palpable and motor and sensation are intact throughout the entire foot.  Skin:    General: Skin is warm and dry.  Neurological:     Mental Status: He is alert and oriented to person, place, and time.     ED Results / Procedures / Treatments   Labs (all labs ordered are listed, but only abnormal results are displayed) Labs Reviewed - No data to display  EKG None  Radiology No results found.  Procedures Procedures    Medications Ordered in ED Medications - No data to display  ED Course/ Medical Decision Making/ A&P  Patient presenting with nontraumatic leg pain that I suspect is cellulitis.  It is warm to the touch and erythematous.  He has no calf  tenderness that would suggest DVT or other abnormality.  Patient to be treated with doxycycline.  And follow-up as needed.  Final Clinical Impression(s) / ED Diagnoses Final diagnoses:  None    Rx / DC Orders ED Discharge Orders     None         Geoffery Lyons, MD 10/12/22 [phone]

## 2022-10-12 NOTE — ED Triage Notes (Signed)
Pt c/o right foot and leg pain x 5 days

## 2022-10-12 NOTE — Discharge Instructions (Addendum)
Taking doxycycline as prescribed.  Take ibuprofen 600 mg every 6 hours as needed for pain.  Follow-up with primary doctor if not improving in the next few days.

## 2022-12-18 ENCOUNTER — Other Ambulatory Visit: Payer: Self-pay

## 2022-12-18 ENCOUNTER — Encounter (HOSPITAL_BASED_OUTPATIENT_CLINIC_OR_DEPARTMENT_OTHER): Payer: Self-pay

## 2022-12-18 ENCOUNTER — Emergency Department (HOSPITAL_BASED_OUTPATIENT_CLINIC_OR_DEPARTMENT_OTHER)
Admission: EM | Admit: 2022-12-18 | Discharge: 2022-12-18 | Disposition: A | Discharge status: Home / Self Care | Payer: Medicaid Other | Attending: Emergency Medicine | Admitting: Emergency Medicine

## 2022-12-18 ENCOUNTER — Emergency Department (HOSPITAL_BASED_OUTPATIENT_CLINIC_OR_DEPARTMENT_OTHER): Payer: Medicaid Other

## 2022-12-18 DIAGNOSIS — M79642 Pain in left hand: Secondary | ICD-10-CM | POA: Insufficient documentation

## 2022-12-18 MED ORDER — PREDNISONE 50 MG PO TABS
60.0000 mg | ORAL_TABLET | Freq: Once | ORAL | Status: AC
  Administered 2022-12-18: 60 mg via ORAL
  Filled 2022-12-18: qty 1

## 2022-12-18 MED ORDER — OXYCODONE-ACETAMINOPHEN 5-325 MG PO TABS: 1.0000 | ORAL_TABLET | Freq: Four times a day (QID) | ORAL | 0 refills | Status: DC | PRN

## 2022-12-18 MED ORDER — PREDNISONE 20 MG PO TABS: 60.0000 mg | ORAL_TABLET | Freq: Every day | ORAL | 0 refills | Status: DC

## 2022-12-18 MED ORDER — KETOROLAC TROMETHAMINE 60 MG/2ML IM SOLN
60.0000 mg | Freq: Once | INTRAMUSCULAR | Status: AC
  Administered 2022-12-18: 60 mg via INTRAMUSCULAR
  Filled 2022-12-18: qty 2

## 2022-12-18 NOTE — ED Notes (Signed)
Velcro splint applied to left wrist, patient tolerated well and reports much improved pain. Reviewed AVS with patient, patient expressed understanding of directions, denies further questions at this time.

## 2022-12-18 NOTE — ED Triage Notes (Signed)
Patient here POV from Home.  Endorses Left Hand Swelling that began mainly today. Was painful for a few days prior.  No Known Trauma. No fevers.   NAD Noted during Triage., A&Ox4. GCS 15. Ambulatory.

## 2022-12-18 NOTE — Discharge Instructions (Addendum)
You were seen in the emergency department for left hand pain and swelling.  Your x-ray did not show any signs of a fracture or dislocation.  Please continue ibuprofen 3 times a day.  We are prescribing you prednisone and a pain medication.  You can use ice to the affected area and use the splint for comfort.  Follow-up with hand surgery Dr. Greta Doom if not improved in the next few days.

## 2022-12-18 NOTE — ED Provider Notes (Signed)
[facility] Provider Note   CSN: [id number] Arrival date & time: 12/18/22  1813     History {Add pertinent medical, surgical, social history, OB history to HPI:1} Chief Complaint  Patient presents with   Hand Pain    Franklin Love is a 42 y.o. male.  He is right-hand dominant.  Complaining of severe left hand pain that started yesterday.  No known trauma.  He denies any injections or open wounds.  No fevers or chills.  He said he has had arthritis before but never hurt this bad.  No history of gout.  The history is provided by the patient.  Hand Pain This is a new problem. The current episode started yesterday. The problem occurs constantly. The problem has not changed since onset.Pertinent negatives include no chest pain, no abdominal pain, no headaches and no shortness of breath. The symptoms are aggravated by bending and twisting. Nothing relieves the symptoms. He has tried rest for the symptoms. The treatment provided no relief.       Home Medications Prior to Admission medications   Medication Sig Start Date End Date Taking? Authorizing Provider  celecoxib (CELEBREX) 200 MG capsule Take 1 capsule (200 mg total) by mouth 2 (two) times daily. 06/16/22   Margarita Mail, PA-C  clotrimazole (LOTRIMIN) 1 % cream Apply to affected area 2 times daily Patient not taking: Reported on 12/26/2021 11/25/21   Couture, Cortni S, PA-C  diclofenac Sodium (VOLTAREN) 1 % GEL Apply 2 g topically 2 (two) times daily as needed (pain).    [provider]  doxycycline (VIBRAMYCIN) 100 MG capsule Take 1 capsule (100 mg total) by mouth 2 (two) times daily. One po bid x 7 days 10/12/22   Veryl Speak, MD  GLUCOSAMINE-CHONDROITIN PO Take 1 capsule by mouth daily.    [provider]  ibuprofen (ADVIL) 200 MG tablet Take 600 mg by mouth every 6 (six) hours as needed for mild pain.    [provider]  methylPREDNISolone (MEDROL DOSEPAK) 4  MG TBPK tablet Use as directed 06/16/22   Margarita Mail, PA-C  naproxen (NAPROSYN) 500 MG tablet Take 1 tablet (500 mg total) by mouth 2 (two) times daily. Patient not taking: Reported on 12/26/2021 11/21/21   Zachery Dakins, MD      Allergies    Patient has no known allergies.    Review of Systems   Review of Systems  Constitutional:  Negative for fever.  Respiratory:  Negative for shortness of breath.   Cardiovascular:  Negative for chest pain.  Gastrointestinal:  Negative for abdominal pain.  Skin:  Positive for wound.  Neurological:  Negative for weakness, numbness and headaches.    Physical Exam Updated Vital Signs BP (!) 159/95 (BP Location: Right Arm)   Pulse 76   Temp 98.4 F (36.9 C) (Oral)   Resp 18   Ht '5\' 7"'$  (1.702 m)   Wt 81.6 kg   SpO2 100%   BMI 28.19 kg/m  Physical Exam Vitals and nursing note reviewed.  Constitutional:      Appearance: Normal appearance. He is well-developed.  HENT:     Head: Normocephalic and atraumatic.  Eyes:     Conjunctiva/sclera: Conjunctivae normal.  Pulmonary:     Effort: Pulmonary effort is normal.  Musculoskeletal:        General: Swelling and tenderness present. No deformity.     Cervical back: Neck supple.     Comments: Left hand diffuse swelling over dorsum.  He is diffusely tender across his metacarpal heads and metacarpals.  There is no open wounds.  No significant warmth.  No tenderness on palmer aspect.  Cap refill and sensory intact.  Motor is intact but he has pain with any range of motion so not giving full effort.  Skin:    General: Skin is warm and dry.     Capillary Refill: Capillary refill takes less than 2 seconds.  Neurological:     General: No focal deficit present.     Mental Status: He is alert.     GCS: GCS eye subscore is 4. GCS verbal subscore is 5. GCS motor subscore is 6.     Sensory: No sensory deficit.     Motor: No weakness.     ED Results / Procedures / Treatments   Labs (all labs  ordered are listed, but only abnormal results are displayed) Labs Reviewed - No data to display  EKG None  Radiology No results found.  Procedures Procedures  {Document cardiac monitor, telemetry assessment procedure when appropriate:1}  Medications Ordered in ED Medications - No data to display  ED Course/ Medical Decision Making/ A&P   {   Click here for ABCD2, HEART and other calculatorsREFRESH Note before signing :1}                          Medical Decision Making Amount and/or Complexity of Data Reviewed Radiology: ordered.   This patient complains of ***; this involves an extensive number of treatment Options and is a complaint that carries with it a high risk of complications and morbidity. The differential includes ***  I ordered, reviewed and interpreted labs, which included *** I ordered medication *** and reviewed PMP when indicated. I ordered imaging studies which included *** and I independently    visualized and interpreted imaging which showed *** Additional history obtained from *** Previous records obtained and reviewed *** I consulted *** and discussed lab and imaging findings and discussed disposition.  Cardiac monitoring reviewed, *** Social determinants considered, *** Critical Interventions: ***  After the interventions stated above, I reevaluated the patient and found *** Admission and further testing considered, ***   {Document critical care time when appropriate:1} {Document review of labs and clinical decision tools ie heart score, Chads2Vasc2 etc:1}  {Document your independent review of radiology images, and any outside records:1} {Document your discussion with family members, caretakers, and with consultants:1} {Document social determinants of health affecting pt's care:1} {Document your decision making why or why not admission, treatments were needed:1} Final Clinical Impression(s) / ED Diagnoses Final diagnoses:  None    Rx / DC  Orders ED Discharge Orders     None

## 2023-02-05 ENCOUNTER — Encounter: Payer: Self-pay | Admitting: *Deleted

## 2023-06-25 ENCOUNTER — Emergency Department (HOSPITAL_BASED_OUTPATIENT_CLINIC_OR_DEPARTMENT_OTHER)
Admission: EM | Admit: 2023-06-25 | Discharge: 2023-06-25 | Disposition: A | Discharge status: Home / Self Care | Payer: Medicaid Other | Attending: Emergency Medicine | Admitting: Emergency Medicine

## 2023-06-25 ENCOUNTER — Other Ambulatory Visit: Payer: Self-pay

## 2023-06-25 ENCOUNTER — Encounter (HOSPITAL_BASED_OUTPATIENT_CLINIC_OR_DEPARTMENT_OTHER): Payer: Self-pay | Admitting: Emergency Medicine

## 2023-06-25 DIAGNOSIS — M545 Low back pain, unspecified: Secondary | ICD-10-CM

## 2023-06-25 DIAGNOSIS — M5440 Lumbago with sciatica, unspecified side: Secondary | ICD-10-CM | POA: Diagnosis not present

## 2023-06-25 LAB — URINALYSIS, ROUTINE W REFLEX MICROSCOPIC
Bilirubin Urine: NEGATIVE
Glucose, UA: NEGATIVE mg/dL
Hgb urine dipstick: NEGATIVE
Ketones, ur: NEGATIVE mg/dL
Leukocytes,Ua: NEGATIVE
Nitrite: NEGATIVE
Specific Gravity, Urine: 1.024 (ref 1.005–1.030)
pH: 6.5 (ref 5.0–8.0)

## 2023-06-25 MED ORDER — LIDOCAINE 5 % EX PTCH
1.0000 | MEDICATED_PATCH | CUTANEOUS | Status: DC
  Administered 2023-06-25: 1 via TRANSDERMAL
  Filled 2023-06-25: qty 1

## 2023-06-25 MED ORDER — CYCLOBENZAPRINE HCL 10 MG PO TABS: 10.0000 mg | ORAL_TABLET | Freq: Two times a day (BID) | ORAL | 0 refills | Status: DC | PRN

## 2023-06-25 MED ORDER — OXYCODONE-ACETAMINOPHEN 5-325 MG PO TABS
1.0000 | ORAL_TABLET | Freq: Once | ORAL | Status: AC
  Administered 2023-06-25: 1 via ORAL
  Filled 2023-06-25: qty 1

## 2023-06-25 MED ORDER — NAPROXEN 500 MG PO TABS: 500.0000 mg | ORAL_TABLET | Freq: Two times a day (BID) | ORAL | 0 refills | Status: DC

## 2023-06-25 NOTE — ED Provider Notes (Signed)
[place] EMERGENCY DEPARTMENT AT [facility] Provider Note   CSN: [id number] Arrival date & time: 06/25/23  1236     History {Add pertinent medical, surgical, social history, OB history to HPI:1} Chief Complaint  Patient presents with   Back Pain    Franklin Love is a 42 y.o. male otherwise healthy presents today for evaluation of back pain.  Patient reports that he has back pain for the last 2 days.  Pain is located on the right side of his back radiating to his hip.  He denies any urinary symptoms, hematuria,urinary retention, bowel incontinence, saddle anesthesia, distal weakness, fever.  Denies IVDU.  States that he has had some heavy lifting at work recently.  Denies any recent fall or trauma.   Back Pain   History reviewed. No pertinent past medical history. History reviewed. No pertinent surgical history.   Home Medications Prior to Admission medications   Medication Sig Start Date End Date Taking? Authorizing Provider  celecoxib (CELEBREX) 200 MG capsule Take 1 capsule (200 mg total) by mouth 2 (two) times daily. 06/16/22   Arthor Captain, PA-C  clotrimazole (LOTRIMIN) 1 % cream Apply to affected area 2 times daily Patient not taking: Reported on 12/26/2021 11/25/21   Couture, Cortni S, PA-C  diclofenac Sodium (VOLTAREN) 1 % GEL Apply 2 g topically 2 (two) times daily as needed (pain).    [provider]  doxycycline (VIBRAMYCIN) 100 MG capsule Take 1 capsule (100 mg total) by mouth 2 (two) times daily. One po bid x 7 days 10/12/22   Geoffery Lyons, MD  GLUCOSAMINE-CHONDROITIN PO Take 1 capsule by mouth daily.    [provider]  ibuprofen (ADVIL) 200 MG tablet Take 600 mg by mouth every 6 (six) hours as needed for mild pain.    [provider]  methylPREDNISolone (MEDROL DOSEPAK) 4 MG TBPK tablet Use as directed 06/16/22   Arthor Captain, PA-C  naproxen (NAPROSYN) 500 MG tablet Take 1 tablet (500 mg total) by mouth 2 (two) times  daily. Patient not taking: Reported on 12/26/2021 11/21/21   Camila Li, MD  oxyCODONE-acetaminophen (PERCOCET/ROXICET) 5-325 MG tablet Take 1 tablet by mouth every 6 (six) hours as needed for severe pain. 12/18/22   Terrilee Files, MD  predniSONE (DELTASONE) 20 MG tablet Take 3 tablets (60 mg total) by mouth daily. 12/18/22   Terrilee Files, MD      Allergies    Patient has no known allergies.    Review of Systems   Review of Systems  Musculoskeletal:  Positive for back pain.    Physical Exam Updated Vital Signs BP 124/79 (BP Location: Right Arm)   Pulse (!) 55   Temp 97.7 F (36.5 C) (Temporal)   Resp 16   Ht 5\' 6"  (1.676 m)   Wt 81.6 kg   SpO2 100%   BMI 29.05 kg/m  Physical Exam Vitals and nursing note reviewed.  Constitutional:      Appearance: Normal appearance.  HENT:     Head: Normocephalic and atraumatic.     Mouth/Throat:     Mouth: Mucous membranes are moist.  Eyes:     General: No scleral icterus. Cardiovascular:     Rate and Rhythm: Normal rate and regular rhythm.     Pulses: Normal pulses.     Heart sounds: Normal heart sounds.  Pulmonary:     Effort: Pulmonary effort is normal.     Breath sounds: Normal breath sounds.  Abdominal:  General: Abdomen is flat.     Palpations: Abdomen is soft.     Tenderness: There is no abdominal tenderness.  Musculoskeletal:        General: No deformity.     Comments: Tenderness to palpation to paraspinal muscle of the lumbar spine on the right side.  Negative straight leg raise.  Skin:    General: Skin is warm.     Findings: No rash.  Neurological:     General: No focal deficit present.     Mental Status: He is alert.  Psychiatric:        Mood and Affect: Mood normal.     ED Results / Procedures / Treatments   Labs (all labs ordered are listed, but only abnormal results are displayed) Labs Reviewed  URINALYSIS, ROUTINE W REFLEX MICROSCOPIC - Abnormal; Notable for the following components:       Result Value   Protein, ur TRACE (*)    All other components within normal limits    EKG None  Radiology No results found.  Procedures Procedures  {Document cardiac monitor, telemetry assessment procedure when appropriate:1}  Medications Ordered in ED Medications  lidocaine (LIDODERM) 5 % 1 patch (1 patch Transdermal Patch Applied 06/25/23 1509)  oxyCODONE-acetaminophen (PERCOCET/ROXICET) 5-325 MG per tablet 1 tablet (1 tablet Oral Given 06/25/23 1508)    ED Course/ Medical Decision Making/ A&P   {   Click here for ABCD2, HEART and other calculatorsREFRESH Note before signing :1}                              Medical Decision Making Amount and/or Complexity of Data Reviewed Labs: ordered.  Risk Prescription drug management.   This patient presents to the ED for ***, this involves an extensive number of treatment options, and is a complaint that carries with a high risk of complications and morbidity.  The differential diagnosis includes ***.  This is not an exhaustive list.  Lab tests: I ordered and personally interpreted labs.  The pertinent results include: WBC unremarkable. Hbg unremarkable. Platelets unremarkable. Electrolytes unremarkable. BUN, creatinine unremarkable. ***  Imaging studies: I ordered imaging studies, personally reviewed, interpreted imaging and agree with the radiologist's interpretations. The results include: ***   Problem list/ ED course/ Critical interventions/ Medical management: HPI: See above Vital signs ***within normal range and stable throughout visit. Laboratory/imaging studies significant for: See above. On physical examination, patient is afebrile and appears in no acute distress. *** I have reviewed the patient home medicines and have made adjustments as needed.  Cardiac monitoring/EKG: The patient was maintained on a cardiac monitor.  I personally reviewed and interpreted the cardiac monitor which showed an underlying rhythm of: sinus  rhythm.  Additional history obtained: External records from outside source obtained and reviewed including: Chart review including previous notes, labs, imaging.  Consultations obtained:  Disposition Continued outpatient therapy. Follow-up with PCP recommended for reevaluation of symptoms. Treatment plan discussed with patient.  Pt acknowledged understanding was agreeable to the plan. Worrisome signs and symptoms were discussed with patient, and patient acknowledged understanding to return to the ED if they noticed these signs and symptoms. Patient was stable upon discharge.   This chart was dictated using voice recognition software.  Despite best efforts to proofread,  errors can occur which can change the documentation meaning.    {Document critical care time when appropriate:1} {Document review of labs and clinical decision tools ie heart score, Chads2Vasc2 etc:1}  {  Document your independent review of radiology images, and any outside records:1} {Document your discussion with family members, caretakers, and with consultants:1} {Document social determinants of health affecting pt's care:1} {Document your decision making why or why not admission, treatments were needed:1} Final Clinical Impression(s) / ED Diagnoses Final diagnoses:  None    Rx / DC Orders ED Discharge Orders     None

## 2023-06-25 NOTE — Discharge Instructions (Addendum)
Please take your medications as prescribed. Take naproxen and muscle relaxant for pain.  Please avoid driving after taking Flexeril.  I recommend close follow-up with PCP or orthopedics for reevaluation.  Please do not hesitate to return to emergency department if worrisome signs symptoms we discussed become apparent.

## 2023-06-25 NOTE — ED Notes (Signed)
Discharge instructions, follow up care, and prescriptions reviewed and explained, pt verbalized understanding and had no further questions on d/c.  

## 2023-06-25 NOTE — ED Triage Notes (Signed)
Pt arrives to ED with c/o right sided back/flank pain x2 days.

## 2023-11-06 ENCOUNTER — Encounter (HOSPITAL_BASED_OUTPATIENT_CLINIC_OR_DEPARTMENT_OTHER): Payer: Self-pay | Admitting: Emergency Medicine

## 2023-11-06 ENCOUNTER — Other Ambulatory Visit: Payer: Self-pay

## 2023-11-06 ENCOUNTER — Emergency Department (HOSPITAL_BASED_OUTPATIENT_CLINIC_OR_DEPARTMENT_OTHER)
Admission: EM | Admit: 2023-11-06 | Discharge: 2023-11-06 | Disposition: A | Discharge status: Home / Self Care | Payer: Medicaid Other | Attending: Emergency Medicine | Admitting: Emergency Medicine

## 2023-11-06 DIAGNOSIS — M436 Torticollis: Secondary | ICD-10-CM | POA: Insufficient documentation

## 2023-11-06 DIAGNOSIS — M542 Cervicalgia: Secondary | ICD-10-CM | POA: Diagnosis present

## 2023-11-06 MED ORDER — CYCLOBENZAPRINE HCL 10 MG PO TABS: 10.0000 mg | ORAL_TABLET | Freq: Two times a day (BID) | ORAL | 0 refills | Status: AC | PRN

## 2023-11-06 MED ORDER — KETOROLAC TROMETHAMINE 60 MG/2ML IM SOLN
30.0000 mg | Freq: Once | INTRAMUSCULAR | Status: DC
  Filled 2023-11-06: qty 2

## 2023-11-06 MED ORDER — NAPROXEN 250 MG PO TABS
375.0000 mg | ORAL_TABLET | Freq: Once | ORAL | Status: AC
  Administered 2023-11-06: 375 mg via ORAL
  Filled 2023-11-06: qty 2

## 2023-11-06 MED ORDER — NAPROXEN 500 MG PO TABS: 500.0000 mg | ORAL_TABLET | Freq: Two times a day (BID) | ORAL | 0 refills | Status: AC

## 2023-11-06 NOTE — ED Provider Notes (Signed)
 [place] EMERGENCY DEPARTMENT AT [facility] Provider Note  CSN: [id number] Arrival date & time: 11/06/23 0221  Chief Complaint(s) Neck Pain  HPI Franklin Love is a 43 y.o. male here for several hours of neck pain.  Patient reports pain came on suddenly while he was was trying to push himself up to stand from a sitting position.  Pain is a deep ache mostly on the left side of the neck which has gradually worsened over the past several hours.  Worse with ranging the neck.  He denies any fall or trauma.  No recent fevers or infections.  No sore throat.  No chest pain.  No focal deficits.  No visual disturbance.  HPI  Past Medical History History reviewed. No pertinent past medical history. Patient Active Problem List   Diagnosis Date Noted   Cellulitis of right foot 11/28/2021   Neck pain 06/23/2020   Acute bilateral low back pain without sciatica 06/23/2020   Home Medication(s) Prior to Admission medications   Medication Sig Start Date End Date Taking? Authorizing Provider  clotrimazole  (LOTRIMIN ) 1 % cream Apply to affected area 2 times daily Patient not taking: Reported on 12/26/2021 11/25/21   Couture, Cortni S, PA-C  cyclobenzaprine  (FLEXERIL ) 10 MG tablet Take 1 tablet (10 mg total) by mouth 2 (two) times daily as needed for muscle spasms. 11/06/23   Margean Korell, Raynell Moder, MD  diclofenac Sodium (VOLTAREN) 1 % GEL Apply 2 g topically 2 (two) times daily as needed (pain).    [provider]  GLUCOSAMINE-CHONDROITIN PO Take 1 capsule by mouth daily.    [provider]  ibuprofen  (ADVIL ) 200 MG tablet Take 600 mg by mouth every 6 (six) hours as needed for mild pain.    [provider]  naproxen  (NAPROSYN ) 500 MG tablet Take 1 tablet (500 mg total) by mouth 2 (two) times daily. 11/06/23   Trine Raynell Moder, MD                                                                                                                                     Allergies Patient has no known allergies.  Review of Systems Review of Systems As noted in HPI  Physical Exam Vital Signs  I have reviewed the triage vital signs BP 139/81 (BP Location: Right Arm)   Pulse 65   Temp 98.2 F (36.8 C)   Resp 16   Ht 5' 6 (1.676 m)   Wt 81.6 kg   SpO2 98%   BMI 29.05 kg/m   Physical Exam Vitals reviewed.  Constitutional:      General: He is not in acute distress.    Appearance: He is well-developed. He is not diaphoretic.  HENT:     Head: Normocephalic and atraumatic.     Right Ear: External ear normal.     Left Ear: External ear normal.     Nose: Nose normal.  Mouth/Throat:     Mouth: Mucous membranes are moist.  Eyes:     General: No scleral icterus.    Conjunctiva/sclera: Conjunctivae normal.  Neck:     Trachea: Phonation normal.   Cardiovascular:     Rate and Rhythm: Normal rate and regular rhythm.  Pulmonary:     Effort: Pulmonary effort is normal. No respiratory distress.     Breath sounds: No stridor.  Abdominal:     General: There is no distension.  Musculoskeletal:        General: Normal range of motion.     Right hand: Normal strength. Normal sensation. Normal pulse.     Left hand: Normal strength. Normal sensation. Normal pulse.     Cervical back: Normal range of motion. Torticollis present. Muscular tenderness present. No spinous process tenderness.  Neurological:     Mental Status: He is alert and oriented to person, place, and time.  Psychiatric:        Behavior: Behavior normal.     ED Results and Treatments Labs (all labs ordered are listed, but only abnormal results are displayed) Labs Reviewed - No data to display                                                                                                                       EKG  EKG Interpretation Date/Time:    Ventricular Rate:    PR Interval:    QRS Duration:    QT Interval:    QTC Calculation:   R Axis:      Text Interpretation:          Radiology No results found.  Medications Ordered in ED Medications  naproxen  (NAPROSYN ) tablet 375 mg (375 mg Oral Given 11/06/23 0437)   Procedures Procedures  (including critical care time) Medical Decision Making / ED Course   Medical Decision Making Risk Prescription drug management.    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  No infectious symptoms concerning for PTA/RPA.  Presentation and exam not suspicious for acute arterial process.  No trauma concerning for injury.  Therapeutic and supportive management recommended.    Final Clinical Impression(s) / ED Diagnoses Final diagnoses:  Torticollis   The patient appears reasonably screened and/or stabilized for discharge and I doubt any other medical condition or other [facility] requiring further screening, evaluation, or treatment in the ED at this time. I have discussed the findings, Dx and Tx plan with the patient/family who expressed understanding and agree(s) with the plan. Discharge instructions discussed at length. The patient/family was given strict return precautions who verbalized understanding of the instructions. No further questions at time of discharge.  Disposition: Discharge  Condition: Good  ED Discharge Orders          Ordered    naproxen  (NAPROSYN ) 500 MG tablet  2 times daily        11/06/23 0424    cyclobenzaprine  (FLEXERIL ) 10 MG tablet  2 times daily PRN  11/06/23 0424             Follow Up: Primary care provider  Call  to schedule an appointment for close follow up    This chart was dictated using voice recognition software.  Despite best efforts to proofread,  errors can occur which can change the documentation meaning.    Trine Raynell Moder, MD 11/06/23 1700

## 2023-11-06 NOTE — ED Triage Notes (Addendum)
  Patient comes in with neck pain that started last night around 2000.  Patient states he took a muscle relaxer around 2030 but did not help.  Tried using heating pad but states pain got worse and neck became more stiff and night progressed.  Patient denies any recent sickness, no photophobia, no fevers.  Hx of pinched nerve.  States pain is at C2 area.  No injury that he is aware of.  No numbness or tingling in extremities.  Pain 10/10, throbbing/aching.

## 2023-11-06 NOTE — Discharge Instructions (Addendum)
 In addition to your prescribed naproxen  and Flexeril , you can take Acetaminophen  (Tylenol ), topical muscle creams such as SalonPas, Federal-mogul, Bengay, etc. Please stretch, apply ice or heat (whichever helps), and have massage therapy for additional assistance.

## 2023-11-08 ENCOUNTER — Ambulatory Visit
Admission: RE | Admit: 2023-11-08 | Discharge: 2023-11-08 | Disposition: A | Discharge status: Home / Self Care | Payer: Medicaid Other | Source: Ambulatory Visit | Attending: Registered Nurse | Admitting: Registered Nurse

## 2023-11-08 ENCOUNTER — Other Ambulatory Visit: Payer: Self-pay | Admitting: Registered Nurse

## 2023-11-08 DIAGNOSIS — M436 Torticollis: Secondary | ICD-10-CM

## 2023-11-21 ENCOUNTER — Ambulatory Visit: Payer: Medicaid Other | Attending: Physician Assistant | Admitting: Physical Therapy

## 2023-11-21 ENCOUNTER — Other Ambulatory Visit: Payer: Self-pay

## 2023-11-21 ENCOUNTER — Encounter: Payer: Self-pay | Admitting: Physical Therapy

## 2023-11-21 DIAGNOSIS — M6281 Muscle weakness (generalized): Secondary | ICD-10-CM | POA: Insufficient documentation

## 2023-11-21 DIAGNOSIS — M542 Cervicalgia: Secondary | ICD-10-CM | POA: Insufficient documentation

## 2023-11-21 DIAGNOSIS — R252 Cramp and spasm: Secondary | ICD-10-CM | POA: Diagnosis present

## 2023-11-21 DIAGNOSIS — R293 Abnormal posture: Secondary | ICD-10-CM | POA: Insufficient documentation

## 2023-11-21 NOTE — Therapy (Unsigned)
OUTPATIENT PHYSICAL THERAPY CERVICAL EVALUATION   Patient Name: Franklin Love MRN: [id number] DOB:05-16-1981, 43 y.o., male Today's Date: 11/22/2023  END OF SESSION:  PT End of Session - 11/21/23 1454     Visit Number 1    Number of Visits 12    Date for PT Re-Evaluation 01/03/24    Authorization Type Medicaid    PT Start Time 1450    PT Stop Time 1530    PT Time Calculation (min) 40 min    Activity Tolerance Patient tolerated treatment well             History reviewed. No pertinent past medical history. History reviewed. No pertinent surgical history. Patient Active Problem List   Diagnosis Date Noted   Cellulitis of right foot 11/28/2021   Neck pain 06/23/2020   Acute bilateral low back pain without sciatica 06/23/2020    PCP: Pcp, No  REFERRING PROVIDER: Remus Loffler, PA-C  REFERRING DIAG: M43.6 (ICD-10-CM) - Torticollis  THERAPY DIAG:  Cervicalgia  Muscle weakness (generalized)  Cramp and spasm  Abnormal posture  Rationale for Evaluation and Treatment: Rehabilitation  ONSET DATE: ~1 month   SUBJECTIVE:                                                                                                                                                                                                         SUBJECTIVE STATEMENT: Pt stated his neck started hurting for the last month. Started off as a "crick" in his neck. Tried to massage it but then after an hour later he could hardly move it. Pt reports it's better now. It's slight now. Feels that his neck is still stiff. Has not seen chiropractor. Pt has a 43 year old boy that he does pick up sometimes 20-30 lbs.  Hand dominance: Right  PERTINENT HISTORY:  Sciatica  PAIN:  Are you having pain? Yes: NPRS scale: 4.5 when he moves his head, 0 at rest  Pain location: R posterior neck Pain description: dull ache Aggravating factors: Lifting, moving head Relieving factors: Heat  PRECAUTIONS: None  RED  FLAGS: None    WEIGHT BEARING RESTRICTIONS: No  FALLS:  Has patient fallen in last 6 months? No  LIVING ENVIRONMENT: Lives with: lives with their family Lives in: House/apartment  OCCUPATION: Not working currently  PLOF: Independent  PATIENT GOALS: Improve neck motion and pain  NEXT MD VISIT: n/a  OBJECTIVE:  Note: Objective measures were completed at Evaluation unless otherwise noted.  DIAGNOSTIC FINDINGS:  FINDINGS: There is no evidence of cervical spine fracture or  prevertebral soft tissue swelling. Minimal kyphosis of cervical spine. Minimal anterior spurring noted at C5-6. No other significant bone abnormalities are identified.  PATIENT SURVEYS:  Neck Disability Index score: 20 / 50 = 40.0 %  COGNITION: Overall cognitive status: Within functional limits for tasks assessed  SENSATION: WFL  POSTURE: forward head  PALPATION: TTP R>L levator scap, UT, upper cervical paraspinals Hypomobile C1-C3 with PA mobs   CERVICAL ROM:   Active ROM A/PROM (deg) eval  Flexion 30  Extension 45  Right lateral flexion 15 pain  Left lateral flexion 28 with pull  Right rotation 28 pain  Left rotation 38   (Blank rows = not tested)  UPPER EXTREMITY ROM:  Active ROM Right eval Left eval  Shoulder flexion 90 pulls into neck   Shoulder extension    Shoulder abduction 100 without compensation   Shoulder adduction    Shoulder extension    Shoulder internal rotation To lower sacrum   Shoulder external rotation C7 with compensatory movement (has to use L UE to assist)   Elbow flexion    Elbow extension    Wrist flexion    Wrist extension    Wrist ulnar deviation    Wrist radial deviation    Wrist pronation    Wrist supination     (Blank rows = not tested)  UPPER EXTREMITY MMT: TBA  MMT Right eval Left eval  Shoulder flexion    Shoulder extension    Shoulder abduction    Shoulder adduction    Shoulder extension    Shoulder internal rotation    Shoulder  external rotation    Middle trapezius    Lower trapezius    Elbow flexion    Elbow extension    Wrist flexion    Wrist extension    Wrist ulnar deviation    Wrist radial deviation    Wrist pronation    Wrist supination    Grip strength     (Blank rows = not tested)  CERVICAL SPECIAL TESTS:  Did not assess  TREATMENT DATE: 11/21/23 See HEP below Modalities: TENS premod to pt tolerance to levator scap bilat x10 min                                                                                                                                  PATIENT EDUCATION:  Education details: Exam findings, POC, initial HEP Person educated: Patient Education method: Programmer, multimedia, Demonstration, and Handouts Education comprehension: verbalized understanding, returned demonstration, and needs further education  HOME EXERCISE PROGRAM: Access Code: [email or web address] URL: [email or web address] Date: 11/21/2023 Prepared by: Vernon Prey April Kirstie Peri  Exercises - Seated Upper Trapezius Stretch  - 1 x daily - 7 x weekly - 2 sets - 30 sec hold - Gentle Levator Scapulae Stretch (Mirrored)  - 1 x daily - 7 x weekly - 2 sets - 30 sec hold - Seated Assisted Cervical Rotation with Towel  - 1 x  daily - 7 x weekly - 2 sets - 30 sec hold - Upper Cervical Extension SNAG with Strap  - 1 x daily - 7 x weekly - 2 sets - 30 sec hold - Standing Cervical Retraction  - 1 x daily - 7 x weekly - 2 sets - 10 reps  ASSESSMENT:  CLINICAL IMPRESSION: Patient is a 43 y.o. M who was seen today for physical therapy evaluation and treatment for torticollis. Pt reports insidious onset that first began as a "crick" in the neck feeling which led to his neck locking up and not being able to move leading to visiting the ER. Pt states it has been improving now but he can still feel it in his neck. Has been trying to do self massage and heat at home. Assessment significant for reduced cervical and shoulder AROM with pulling  into his cervical muscles. Pt with multiple neck/shoulder trigger points as well as upper cervical hypomobility. Pt will benefit from PT to address these deficits for return to full function.   OBJECTIVE IMPAIRMENTS: decreased activity tolerance, decreased balance, decreased coordination, decreased endurance, decreased mobility, decreased ROM, decreased strength, hypomobility, increased fascial restrictions, increased muscle spasms, impaired flexibility, impaired UE functional use, improper body mechanics, postural dysfunction, and pain.   ACTIVITY LIMITATIONS: carrying, lifting, sleeping, bathing, toileting, dressing, reach over head, hygiene/grooming, and caring for others  PARTICIPATION LIMITATIONS: meal prep, cleaning, laundry, driving, shopping, and community activity  PERSONAL FACTORS: Age, Fitness, Past/current experiences, and Time since onset of injury/illness/exacerbation are also affecting patient's functional outcome.   REHAB POTENTIAL: Good  CLINICAL DECISION MAKING: Evolving/moderate complexity  EVALUATION COMPLEXITY: Moderate   GOALS: Goals reviewed with patient? Yes  SHORT TERM GOALS: Target date: 12/13/2023   Pt will be ind with initial HEP Baseline:  Goal status: INITIAL  2.  Pt will have improved cervical rotation by at least 10 deg R&L Baseline:  Goal status: INITIAL  3.  Pt will report 50% improvement in his overall pain Baseline:  Goal status: INITIAL    LONG TERM GOALS: Target date: 01/03/2024   Pt will be ind with management and progression of HEP Baseline:  Goal status: INITIAL  2.  Pt will demo at least 60 deg of cervical rotation R&L Baseline:  Goal status: INITIAL  3.  Pt will demo full shoulder ROM without pain/pulling into his neck for overhead lifting Baseline:  Goal status: INITIAL  4.  Pt will report >/=75% improvement in overall pain Baseline:  Goal status: INITIAL  5.  Pt will have improved NDI to </=30% to demo MCID Baseline:   Goal status: INITIAL   PLAN:  PT FREQUENCY: 1-2x/week  PT DURATION: 6 weeks  PLANNED INTERVENTIONS: 97164- PT Re-evaluation, 97110-Therapeutic exercises, 97530- Therapeutic activity, 97112- Neuromuscular re-education, 97535- Self Care, 65784- Manual therapy, 97014- Electrical stimulation (unattended), 97033- Ionotophoresis 4mg /ml Dexamethasone, Patient/Family education, Taping, Dry Needling, Joint mobilization, Spinal mobilization, Cryotherapy, and Moist heat  PLAN FOR NEXT SESSION: Assess response to HEP. Manual work for multiple trigger points and joint hypomobility. Work on decreasing forward head as it is placing pressure on upper cervical. E-stim as needed. Had deferred TPDN on eval, but may want it in subsequent sessions.    Burhan Barham April Ma L Pierce Biagini, PT 11/22/2023, 8:33 AM

## 2023-11-28 ENCOUNTER — Ambulatory Visit: Payer: Medicaid Other | Admitting: Physical Therapy

## 2023-11-28 NOTE — Therapy (Deleted)
 OUTPATIENT PHYSICAL THERAPY CERVICAL EVALUATION   Patient Name: Franklin Love MRN: [id number] DOB:1981/03/29, 43 y.o., male Today's Date: 11/28/2023  END OF SESSION:    No past medical history on file. No past surgical history on file. Patient Active Problem List   Diagnosis Date Noted   Cellulitis of right foot 11/28/2021   Neck pain 06/23/2020   Acute bilateral low back pain without sciatica 06/23/2020    PCP: Pcp, No  REFERRING PROVIDER: Joshua Clayborne RAMAN, PA-C  REFERRING DIAG: M43.6 (ICD-10-CM) - Torticollis  THERAPY DIAG:  No diagnosis found.  Rationale for Evaluation and Treatment: Rehabilitation  ONSET DATE: ~1 month   SUBJECTIVE:                                                                                                                                                                                                         SUBJECTIVE STATEMENT: ***  From eval: Pt stated his neck started hurting for the last month. Started off as a crick in his neck. Tried to massage it but then after an hour later he could hardly move it. Pt reports it's better now. It's slight now. Feels that his neck is still stiff. Has not seen chiropractor. Pt has a 43 year old boy that he does pick up sometimes 20-30 lbs.  Hand dominance: Right  PERTINENT HISTORY:  Sciatica  PAIN:  Are you having pain? Yes: NPRS scale: 4.5 when he moves his head, 0 at rest  Pain location: R posterior neck Pain description: dull ache Aggravating factors: Lifting, moving head Relieving factors: Heat  PRECAUTIONS: None  RED FLAGS: None    WEIGHT BEARING RESTRICTIONS: No  FALLS:  Has patient fallen in last 6 months? No  LIVING ENVIRONMENT: Lives with: lives with their family Lives in: House/apartment  OCCUPATION: Not working currently  PLOF: Independent  PATIENT GOALS: Improve neck motion and pain  NEXT MD VISIT: n/a  OBJECTIVE:  Note: Objective measures were completed at Evaluation  unless otherwise noted.  DIAGNOSTIC FINDINGS:  FINDINGS: There is no evidence of cervical spine fracture or prevertebral soft tissue swelling. Minimal kyphosis of cervical spine. Minimal anterior spurring noted at C5-6. No other significant bone abnormalities are identified.  PATIENT SURVEYS:  Neck Disability Index score: 20 / 50 = 40.0 %  PALPATION: TTP R>L levator scap, UT, upper cervical paraspinals Hypomobile C1-C3 with PA mobs   CERVICAL ROM:   Active ROM A/PROM (deg) eval  Flexion 30  Extension 45  Right lateral flexion 15 pain  Left lateral flexion 28 with pull  Right  rotation 28 pain  Left rotation 38   (Blank rows = not tested)  UPPER EXTREMITY ROM:  Active ROM Right eval Left eval  Shoulder flexion 90 pulls into neck   Shoulder extension    Shoulder abduction 100 without compensation   Shoulder adduction    Shoulder extension    Shoulder internal rotation To lower sacrum   Shoulder external rotation C7 with compensatory movement (has to use L UE to assist)   Elbow flexion    Elbow extension    Wrist flexion    Wrist extension    Wrist ulnar deviation    Wrist radial deviation    Wrist pronation    Wrist supination     (Blank rows = not tested)  UPPER EXTREMITY MMT: TBA  MMT Right eval Left eval  Shoulder flexion    Shoulder extension    Shoulder abduction    Shoulder adduction    Shoulder extension    Shoulder internal rotation    Shoulder external rotation    Middle trapezius    Lower trapezius    Elbow flexion    Elbow extension    Wrist flexion    Wrist extension    Wrist ulnar deviation    Wrist radial deviation    Wrist pronation    Wrist supination    Grip strength     (Blank rows = not tested)  CERVICAL SPECIAL TESTS:  Did not assess  TREATMENT DATE:  11/28/23 ***  11/21/23 See HEP below Modalities: TENS premod to pt tolerance to levator scap bilat x10 min                                                                                                                                   PATIENT EDUCATION:  Education details: Exam findings, POC, initial HEP Person educated: Patient Education method: Explanation, Demonstration, and Handouts Education comprehension: verbalized understanding, returned demonstration, and needs further education  HOME EXERCISE PROGRAM: Access Code: [email or web address] URL: [email or web address] Date: 11/21/2023 Prepared by: Furman Trentman April Earnie Starring  Exercises - Seated Upper Trapezius Stretch  - 1 x daily - 7 x weekly - 2 sets - 30 sec hold - Gentle Levator Scapulae Stretch (Mirrored)  - 1 x daily - 7 x weekly - 2 sets - 30 sec hold - Seated Assisted Cervical Rotation with Towel  - 1 x daily - 7 x weekly - 2 sets - 30 sec hold - Upper Cervical Extension SNAG with Strap  - 1 x daily - 7 x weekly - 2 sets - 30 sec hold - Standing Cervical Retraction  - 1 x daily - 7 x weekly - 2 sets - 10 reps  ASSESSMENT:  CLINICAL IMPRESSION: ***  From eval: Patient is a 43 y.o. M who was seen today for physical therapy evaluation and treatment for torticollis. Pt reports insidious onset that first began as a crick in the neck feeling which led  to his neck locking up and not being able to move leading to visiting the ER. Pt states it has been improving now but he can still feel it in his neck. Has been trying to do self massage and heat at home. Assessment significant for reduced cervical and shoulder AROM with pulling into his cervical muscles. Pt with multiple neck/shoulder trigger points as well as upper cervical hypomobility. Pt will benefit from PT to address these deficits for return to full function.   OBJECTIVE IMPAIRMENTS: decreased activity tolerance, decreased balance, decreased coordination, decreased endurance, decreased mobility, decreased ROM, decreased strength, hypomobility, increased fascial restrictions, increased muscle spasms, impaired flexibility, impaired UE  functional use, improper body mechanics, postural dysfunction, and pain.     GOALS: Goals reviewed with patient? Yes  SHORT TERM GOALS: Target date: 12/13/2023   Pt will be ind with initial HEP Baseline:  Goal status: INITIAL  2.  Pt will have improved cervical rotation by at least 10 deg R&L Baseline:  Goal status: INITIAL  3.  Pt will report 50% improvement in his overall pain Baseline:  Goal status: INITIAL    LONG TERM GOALS: Target date: 01/03/2024   Pt will be ind with management and progression of HEP Baseline:  Goal status: INITIAL  2.  Pt will demo at least 60 deg of cervical rotation R&L Baseline:  Goal status: INITIAL  3.  Pt will demo full shoulder ROM without pain/pulling into his neck for overhead lifting Baseline:  Goal status: INITIAL  4.  Pt will report >/=75% improvement in overall pain Baseline:  Goal status: INITIAL  5.  Pt will have improved NDI to </=30% to demo MCID Baseline:  Goal status: INITIAL   PLAN:  PT FREQUENCY: 1-2x/week  PT DURATION: 6 weeks  PLANNED INTERVENTIONS: 97164- PT Re-evaluation, 97110-Therapeutic exercises, 97530- Therapeutic activity, 97112- Neuromuscular re-education, 97535- Self Care, 02859- Manual therapy, 97014- Electrical stimulation (unattended), 97033- Ionotophoresis 4mg /ml Dexamethasone , Patient/Family education, Taping, Dry Needling, Joint mobilization, Spinal mobilization, Cryotherapy, and Moist heat  PLAN FOR NEXT SESSION: Assess response to HEP. Manual work for multiple trigger points and joint hypomobility. Work on decreasing forward head as it is placing pressure on upper cervical. E-stim as needed. Had deferred TPDN on eval, but may want it in subsequent sessions.    Tyjuan Demetro April Ma L Teliyah Royal, PT 11/28/2023, 10:56 AM

## 2023-11-30 ENCOUNTER — Ambulatory Visit: Payer: Medicaid Other | Attending: Physician Assistant | Admitting: Physical Therapy

## 2023-11-30 DIAGNOSIS — R293 Abnormal posture: Secondary | ICD-10-CM | POA: Insufficient documentation

## 2023-11-30 DIAGNOSIS — M542 Cervicalgia: Secondary | ICD-10-CM | POA: Insufficient documentation

## 2023-11-30 DIAGNOSIS — M6281 Muscle weakness (generalized): Secondary | ICD-10-CM | POA: Insufficient documentation

## 2023-11-30 DIAGNOSIS — R252 Cramp and spasm: Secondary | ICD-10-CM | POA: Insufficient documentation

## 2023-11-30 NOTE — Therapy (Deleted)
 OUTPATIENT PHYSICAL THERAPY CERVICAL TREATMENT   Patient Name: Franklin Love MRN: [id number] DOB:[birth year], 43 y.o., male Today's Date: 11/30/2023  END OF SESSION:    No past medical history on file. No past surgical history on file. Patient Active Problem List   Diagnosis Date Noted   Cellulitis of right foot 11/28/2021   Neck pain 06/23/2020   Acute bilateral low back pain without sciatica 06/23/2020    PCP: Pcp, No  REFERRING PROVIDER: Joshua Clayborne RAMAN, PA-C  REFERRING DIAG: M43.6 (ICD-10-CM) - Torticollis  THERAPY DIAG:  No diagnosis found.  Rationale for Evaluation and Treatment: Rehabilitation  ONSET DATE: ~1 month   SUBJECTIVE:                                                                                                                                                                                                         SUBJECTIVE STATEMENT: ***  From eval: Pt stated his neck started hurting for the last month. Started off as a crick in his neck. Tried to massage it but then after an hour later he could hardly move it. Pt reports it's better now. It's slight now. Feels that his neck is still stiff. Has not seen chiropractor. Pt has a 43 year old boy that he does pick up sometimes 20-30 lbs.  Hand dominance: Right  PERTINENT HISTORY:  Sciatica  PAIN:  Are you having pain? Yes: NPRS scale: 4.5 when he moves his head, 0 at rest  Pain location: R posterior neck Pain description: dull ache Aggravating factors: Lifting, moving head Relieving factors: Heat  PRECAUTIONS: None  RED FLAGS: None    WEIGHT BEARING RESTRICTIONS: No  FALLS:  Has patient fallen in last 6 months? No  LIVING ENVIRONMENT: Lives with: lives with their family Lives in: House/apartment  OCCUPATION: Not working currently  PLOF: Independent  PATIENT GOALS: Improve neck motion and pain  NEXT MD VISIT: n/a  OBJECTIVE:  Note: Objective measures were completed at Evaluation  unless otherwise noted.  DIAGNOSTIC FINDINGS:  FINDINGS: There is no evidence of cervical spine fracture or prevertebral soft tissue swelling. Minimal kyphosis of cervical spine. Minimal anterior spurring noted at C5-6. No other significant bone abnormalities are identified.  PATIENT SURVEYS:  Neck Disability Index score: 20 / 50 = 40.0 %  PALPATION: TTP R>L levator scap, UT, upper cervical paraspinals Hypomobile C1-C3 with PA mobs   CERVICAL ROM:   Active ROM A/PROM (deg) eval  Flexion 30  Extension 45  Right lateral flexion 15 pain  Left lateral flexion 28 with pull  Right  rotation 28 pain  Left rotation 38   (Blank rows = not tested)  UPPER EXTREMITY ROM:  Active ROM Right eval Left eval  Shoulder flexion 90 pulls into neck   Shoulder extension    Shoulder abduction 100 without compensation   Shoulder adduction    Shoulder extension    Shoulder internal rotation To lower sacrum   Shoulder external rotation C7 with compensatory movement (has to use L UE to assist)   Elbow flexion    Elbow extension    Wrist flexion    Wrist extension    Wrist ulnar deviation    Wrist radial deviation    Wrist pronation    Wrist supination     (Blank rows = not tested)  UPPER EXTREMITY MMT: TBA  MMT Right eval Left eval  Shoulder flexion    Shoulder extension    Shoulder abduction    Shoulder adduction    Shoulder extension    Shoulder internal rotation    Shoulder external rotation    Middle trapezius    Lower trapezius    Elbow flexion    Elbow extension    Wrist flexion    Wrist extension    Wrist ulnar deviation    Wrist radial deviation    Wrist pronation    Wrist supination    Grip strength     (Blank rows = not tested)  CERVICAL SPECIAL TESTS:  Did not assess  TREATMENT DATE:  11/28/23 ***  11/21/23 See HEP below Modalities: TENS premod to pt tolerance to levator scap bilat x10 min                                                                                                                                   PATIENT EDUCATION:  Education details: Exam findings, POC, initial HEP Person educated: Patient Education method: Explanation, Demonstration, and Handouts Education comprehension: verbalized understanding, returned demonstration, and needs further education  HOME EXERCISE PROGRAM: Access Code: [email or web address] URL: [email or web address] Date: 11/21/2023 Prepared by: Sharmin Foulk April Earnie Starring  Exercises - Seated Upper Trapezius Stretch  - 1 x daily - 7 x weekly - 2 sets - 30 sec hold - Gentle Levator Scapulae Stretch (Mirrored)  - 1 x daily - 7 x weekly - 2 sets - 30 sec hold - Seated Assisted Cervical Rotation with Towel  - 1 x daily - 7 x weekly - 2 sets - 30 sec hold - Upper Cervical Extension SNAG with Strap  - 1 x daily - 7 x weekly - 2 sets - 30 sec hold - Standing Cervical Retraction  - 1 x daily - 7 x weekly - 2 sets - 10 reps  ASSESSMENT:  CLINICAL IMPRESSION: ***  From eval: Patient is a 43 y.o. M who was seen today for physical therapy evaluation and treatment for torticollis. Pt reports insidious onset that first began as a crick in the neck feeling which led  to his neck locking up and not being able to move leading to visiting the ER. Pt states it has been improving now but he can still feel it in his neck. Has been trying to do self massage and heat at home. Assessment significant for reduced cervical and shoulder AROM with pulling into his cervical muscles. Pt with multiple neck/shoulder trigger points as well as upper cervical hypomobility. Pt will benefit from PT to address these deficits for return to full function.   OBJECTIVE IMPAIRMENTS: decreased activity tolerance, decreased balance, decreased coordination, decreased endurance, decreased mobility, decreased ROM, decreased strength, hypomobility, increased fascial restrictions, increased muscle spasms, impaired flexibility, impaired UE  functional use, improper body mechanics, postural dysfunction, and pain.     GOALS: Goals reviewed with patient? Yes  SHORT TERM GOALS: Target date: 12/13/2023   Pt will be ind with initial HEP Baseline:  Goal status: INITIAL  2.  Pt will have improved cervical rotation by at least 10 deg R&L Baseline:  Goal status: INITIAL  3.  Pt will report 50% improvement in his overall pain Baseline:  Goal status: INITIAL    LONG TERM GOALS: Target date: 01/03/2024   Pt will be ind with management and progression of HEP Baseline:  Goal status: INITIAL  2.  Pt will demo at least 60 deg of cervical rotation R&L Baseline:  Goal status: INITIAL  3.  Pt will demo full shoulder ROM without pain/pulling into his neck for overhead lifting Baseline:  Goal status: INITIAL  4.  Pt will report >/=75% improvement in overall pain Baseline:  Goal status: INITIAL  5.  Pt will have improved NDI to </=30% to demo MCID Baseline:  Goal status: INITIAL   PLAN:  PT FREQUENCY: 1-2x/week  PT DURATION: 6 weeks  PLANNED INTERVENTIONS: 97164- PT Re-evaluation, 97110-Therapeutic exercises, 97530- Therapeutic activity, 97112- Neuromuscular re-education, 97535- Self Care, 02859- Manual therapy, 97014- Electrical stimulation (unattended), 97033- Ionotophoresis 4mg /ml Dexamethasone , Patient/Family education, Taping, Dry Needling, Joint mobilization, Spinal mobilization, Cryotherapy, and Moist heat  PLAN FOR NEXT SESSION: Assess response to HEP. Manual work for multiple trigger points and joint hypomobility. Work on decreasing forward head as it is placing pressure on upper cervical. E-stim as needed. Had deferred TPDN on eval, but may want it in subsequent sessions.    Lj Miyamoto April Ma L Gustaf Mccarter, PT 11/30/2023, 8:45 AM

## 2023-12-10 ENCOUNTER — Ambulatory Visit: Payer: Medicaid Other | Admitting: Physical Therapy

## 2023-12-10 DIAGNOSIS — R293 Abnormal posture: Secondary | ICD-10-CM

## 2023-12-10 DIAGNOSIS — R252 Cramp and spasm: Secondary | ICD-10-CM | POA: Diagnosis present

## 2023-12-10 DIAGNOSIS — M542 Cervicalgia: Secondary | ICD-10-CM

## 2023-12-10 DIAGNOSIS — M6281 Muscle weakness (generalized): Secondary | ICD-10-CM

## 2023-12-10 NOTE — Therapy (Signed)
 OUTPATIENT PHYSICAL THERAPY CERVICAL TREATMENT   Patient Name: Nikodem Leadbetter MRN: [id number] DOB:[birth year], 43 y.o., male Today's Date: 12/10/2023  END OF SESSION:  PT End of Session - 12/10/23 1450     Visit Number 2    Number of Visits 12    Date for PT Re-Evaluation 01/03/24    Authorization Type Medicaid    PT Start Time 1450   pt arrived late   PT Stop Time 1530    PT Time Calculation (min) 40 min    Activity Tolerance Patient tolerated treatment well    Behavior During Therapy [facility] for tasks assessed/performed              No past medical history on file. No past surgical history on file. Patient Active Problem List   Diagnosis Date Noted   Cellulitis of right foot 11/28/2021   Neck pain 06/23/2020   Acute bilateral low back pain without sciatica 06/23/2020    PCP: Pcp, No  REFERRING PROVIDER: Remus Loffler, PA-C  REFERRING DIAG: M43.6 (ICD-10-CM) - Torticollis  THERAPY DIAG:  Cervicalgia  Muscle weakness (generalized)  Cramp and spasm  Abnormal posture  Rationale for Evaluation and Treatment: Rehabilitation  ONSET DATE: ~1 month   SUBJECTIVE:                                                                                                                                                                                                         SUBJECTIVE STATEMENT: Pt has been doing HEP from April PT from initial eval; still gets pain if he moves certain ways. Pt reports that his pain is about the same as it was at initial eval, has had difficulty attending PT sessions due to weather and kids schedules.  Hand dominance: Right  PERTINENT HISTORY:  Sciatica  PAIN:  Are you having pain? Yes: NPRS scale: 4.5 when he moves his head, 0 at rest  Pain location: R posterior neck Pain description: dull ache Aggravating factors: Lifting, moving head Relieving factors: Heat  PRECAUTIONS: None  RED FLAGS: None    WEIGHT BEARING RESTRICTIONS:  No  FALLS:  Has patient fallen in last 6 months? No  LIVING ENVIRONMENT: Lives with: lives with their family Lives in: House/apartment  OCCUPATION: Not working currently  PLOF: Independent  PATIENT GOALS: Improve neck motion and pain  NEXT MD VISIT: n/a  OBJECTIVE:  Note: Objective measures were completed at Evaluation unless otherwise noted.  DIAGNOSTIC FINDINGS:  FINDINGS: There is no evidence of cervical spine fracture or prevertebral soft tissue swelling. Minimal kyphosis of  cervical spine. Minimal anterior spurring noted at C5-6. No other significant bone abnormalities are identified.  PATIENT SURVEYS:  Neck Disability Index score: 20 / 50 = 40.0 %  COGNITION: Overall cognitive status: Within functional limits for tasks assessed  SENSATION: WFL  POSTURE: forward head  PALPATION: TTP R>L levator scap, UT, upper cervical paraspinals Hypomobile C1-C3 with PA mobs   CERVICAL ROM:   Active ROM A/PROM (deg) eval  Flexion 30  Extension 45  Right lateral flexion 15 pain  Left lateral flexion 28 with pull  Right rotation 28 pain  Left rotation 38   (Blank rows = not tested)  UPPER EXTREMITY ROM:  Active ROM Right eval Left eval  Shoulder flexion 90 pulls into neck   Shoulder extension    Shoulder abduction 100 without compensation   Shoulder adduction    Shoulder extension    Shoulder internal rotation To lower sacrum   Shoulder external rotation C7 with compensatory movement (has to use L UE to assist)   Elbow flexion    Elbow extension    Wrist flexion    Wrist extension    Wrist ulnar deviation    Wrist radial deviation    Wrist pronation    Wrist supination     (Blank rows = not tested)  UPPER EXTREMITY MMT: TBA  MMT Right eval Left eval  Shoulder flexion    Shoulder extension    Shoulder abduction    Shoulder adduction    Shoulder extension    Shoulder internal rotation    Shoulder external rotation    Middle trapezius    Lower  trapezius    Elbow flexion    Elbow extension    Wrist flexion    Wrist extension    Wrist ulnar deviation    Wrist radial deviation    Wrist pronation    Wrist supination    Grip strength     (Blank rows = not tested)  CERVICAL SPECIAL TESTS:  Did not assess  TREATMENT:  TherAct   Quick cervical AROM assessment:      Flexion: pain in R side of neck Extension: pain in middle of neck L lateral flexion: pain in L suboccipitals R lateral flexion: pain in L lateral neck region L and R cervical rotation: pain in both sides back of neck  Education regarding TPDN, however pt does not like needles so deferred any treatment with DN this date.  Manual Therapy Suboccipital release and STM/TPR to suboccipital and cervical paraspinal region. Pt with tightness in cervical paraspinals (R>L). Demonstrated how to use tennis balls to perform suboccipital release. Also trialed use of Chirp Wheel XR to address trigger points in cervical paraspinals. Pt with better relief with use of this device, provided handout for where to purchase device online.  While laying supine on Chirp Wheel: Shoulder flexion x 10 reps Limited to about 110 degrees before onset of pain and limited ROM (R>L)    Shoulder abduction x 10 reps Limited to about 110 degrees before onset of pain and limited ROM (R>L)  Supine B shoulder AAROM to work on limited B shoulder ROM with dowel rod: Flexion x 10 reps Abduction x 10 reps B Added to HEP, see bolded below   PATIENT EDUCATION:  Education details: continue HEP and added to [employer], [employer] XR Person educated: Patient Education method: Programmer, multimedia, Facilities manager, and Handouts Education comprehension: verbalized understanding, returned demonstration, and needs further education  HOME EXERCISE PROGRAM: Access Code: [email or web address] URL: [email or web address] Date: 11/21/2023  Prepared by: Vernon Prey April Kirstie Peri  Exercises - Seated Upper Trapezius Stretch  -  1 x daily - 7 x weekly - 2 sets - 30 sec hold - Gentle Levator Scapulae Stretch (Mirrored)  - 1 x daily - 7 x weekly - 2 sets - 30 sec hold - Seated Assisted Cervical Rotation with Towel  - 1 x daily - 7 x weekly - 2 sets - 30 sec hold - Upper Cervical Extension SNAG with Strap  - 1 x daily - 7 x weekly - 2 sets - 30 sec hold - Standing Cervical Retraction  - 1 x daily - 7 x weekly - 2 sets - 10 reps - Supine Shoulder Flexion with Dowel  - 1 x daily - 7 x weekly - 3 sets - 10 reps - Standing Shoulder Abduction AAROM with Dowel  - 1 x daily - 7 x weekly - 3 sets - 10 reps   ASSESSMENT:  CLINICAL IMPRESSION: Emphasis of skilled PT session on quickly assessing cervical AROM as this is patient's first return visit since initial eval, performing manual therapy to address tightness and suboccipitals and cervical paraspinals, and adding stretches to HEP based on B shoulder ROM limitations observed this date. Pt does not like needles so did educate him on TPDN but no needling performed this date due to fear of needles. Pt benefits from manual therapy and stretching to address cervical muscle pain and tightness. Pt noted to have limited B shoulder AROM (R>L) so added in dowel rod stretches to his HEP. Pt continues to benefit from skilled PT services to work towards improving his cervical and shoulder ROM in order to decrease his pain and improve his function. Continue POC.   OBJECTIVE IMPAIRMENTS: decreased activity tolerance, decreased balance, decreased coordination, decreased endurance, decreased mobility, decreased ROM, decreased strength, hypomobility, increased fascial restrictions, increased muscle spasms, impaired flexibility, impaired UE functional use, improper body mechanics, postural dysfunction, and pain.   ACTIVITY LIMITATIONS: carrying, lifting, sleeping, bathing, toileting, dressing, reach over head, hygiene/grooming, and caring for others  PARTICIPATION LIMITATIONS: meal prep, cleaning,  laundry, driving, shopping, and community activity  PERSONAL FACTORS: Age, Fitness, Past/current experiences, and Time since onset of injury/illness/exacerbation are also affecting patient's functional outcome.   REHAB POTENTIAL: Good  CLINICAL DECISION MAKING: Evolving/moderate complexity  EVALUATION COMPLEXITY: Moderate   GOALS: Goals reviewed with patient? Yes  SHORT TERM GOALS: Target date: 12/13/2023   Pt will be ind with initial HEP Baseline:  Goal status: INITIAL  2.  Pt will have improved cervical rotation by at least 10 deg R&L Baseline:  Goal status: INITIAL  3.  Pt will report 50% improvement in his overall pain Baseline:  Goal status: INITIAL    LONG TERM GOALS: Target date: 01/03/2024   Pt will be ind with management and progression of HEP Baseline:  Goal status: INITIAL  2.  Pt will demo at least 60 deg of cervical rotation R&L Baseline:  Goal status: INITIAL  3.  Pt will demo full shoulder ROM without pain/pulling into his neck for overhead lifting Baseline:  Goal status: INITIAL  4.  Pt will report >/=75% improvement in overall pain Baseline:  Goal status: INITIAL  5.  Pt will have improved NDI to </=30% to demo MCID Baseline:  Goal status: INITIAL   PLAN:  PT FREQUENCY: 1-2x/week  PT DURATION: 6 weeks  PLANNED INTERVENTIONS: 97164- PT Re-evaluation, 97110-Therapeutic exercises, 97530- Therapeutic activity, 97112- Neuromuscular re-education, 97535- Self Care, 16109- Manual therapy, 97014- Electrical stimulation (  unattended), [phone]- Ionotophoresis 4mg /ml Dexamethasone, Patient/Family education, Taping, Dry Needling, Joint mobilization, Spinal mobilization, Cryotherapy, and Moist heat  PLAN FOR NEXT SESSION: STG assess, Assess response to HEP. Manual work for multiple trigger points and joint hypomobility. Work on decreasing forward head as it is placing pressure on upper cervical. E-stim as needed. NO TPDN,  shoulder stabilization and  strengthening (try prone or seated), SCM stretch?, cervical retract + rotate   Peter Congo, PT Peter Congo, PT, DPT, CSRS  12/10/2023, 3:31 PM

## 2023-12-13 ENCOUNTER — Ambulatory Visit: Payer: Medicaid Other | Admitting: Physical Therapy

## 2023-12-13 NOTE — Therapy (Incomplete)
OUTPATIENT PHYSICAL THERAPY CERVICAL TREATMENT   Patient Name: Franklin Love MRN: [id number] DOB:[birth year], 43 y.o., male Today's Date: 12/13/2023  END OF SESSION:     No past medical history on file. No past surgical history on file. Patient Active Problem List   Diagnosis Date Noted   Cellulitis of right foot 11/28/2021   Neck pain 06/23/2020   Acute bilateral low back pain without sciatica 06/23/2020    PCP: Pcp, No  REFERRING PROVIDER: Remus Loffler, PA-C  REFERRING DIAG: M43.6 (ICD-10-CM) - Torticollis  THERAPY DIAG:  No diagnosis found.  Rationale for Evaluation and Treatment: Rehabilitation  ONSET DATE: ~1 month   SUBJECTIVE:                                                                                                                                                                                                         SUBJECTIVE STATEMENT: ***  Hand dominance: Right  PERTINENT HISTORY:  Sciatica  PAIN:  Are you having pain? Yes: NPRS scale: 4.5 when he moves his head, 0 at rest  Pain location: R posterior neck Pain description: dull ache Aggravating factors: Lifting, moving head Relieving factors: Heat  PRECAUTIONS: None  RED FLAGS: None    WEIGHT BEARING RESTRICTIONS: No  FALLS:  Has patient fallen in last 6 months? No  LIVING ENVIRONMENT: Lives with: lives with their family Lives in: House/apartment  OCCUPATION: Not working currently  PLOF: Independent  PATIENT GOALS: Improve neck motion and pain  NEXT MD VISIT: n/a  OBJECTIVE:  Note: Objective measures were completed at Evaluation unless otherwise noted.  DIAGNOSTIC FINDINGS:  FINDINGS: There is no evidence of cervical spine fracture or prevertebral soft tissue swelling. Minimal kyphosis of cervical spine. Minimal anterior spurring noted at C5-6. No other significant bone abnormalities are identified.  PATIENT SURVEYS:  Neck Disability Index score: 20 / 50 = 40.0  %  COGNITION: Overall cognitive status: Within functional limits for tasks assessed  SENSATION: WFL  POSTURE: forward head  PALPATION: TTP R>L levator scap, UT, upper cervical paraspinals Hypomobile C1-C3 with PA mobs   CERVICAL ROM:   Active ROM A/PROM (deg) eval  Flexion 30  Extension 45  Right lateral flexion 15 pain  Left lateral flexion 28 with pull  Right rotation 28 pain  Left rotation 38   (Blank rows = not tested)  UPPER EXTREMITY ROM:  Active ROM Right eval Left eval  Shoulder flexion 90 pulls into neck   Shoulder extension    Shoulder abduction 100 without compensation   Shoulder adduction  Shoulder extension    Shoulder internal rotation To lower sacrum   Shoulder external rotation C7 with compensatory movement (has to use L UE to assist)   Elbow flexion    Elbow extension    Wrist flexion    Wrist extension    Wrist ulnar deviation    Wrist radial deviation    Wrist pronation    Wrist supination     (Blank rows = not tested)  UPPER EXTREMITY MMT: TBA  MMT Right eval Left eval  Shoulder flexion    Shoulder extension    Shoulder abduction    Shoulder adduction    Shoulder extension    Shoulder internal rotation    Shoulder external rotation    Middle trapezius    Lower trapezius    Elbow flexion    Elbow extension    Wrist flexion    Wrist extension    Wrist ulnar deviation    Wrist radial deviation    Wrist pronation    Wrist supination    Grip strength     (Blank rows = not tested)  CERVICAL SPECIAL TESTS:  Did not assess  TREATMENT:  TherAct   Quick cervical AROM assessment:      Flexion: pain in R side of neck Extension: pain in middle of neck L lateral flexion: pain in L suboccipitals R lateral flexion: pain in L lateral neck region L and R cervical rotation: pain in both sides back of neck  ***  Manual Therapy Suboccipital release and STM/TPR to suboccipital and cervical paraspinal region. Pt with tightness in  cervical paraspinals (R>L). Demonstrated how to use tennis balls to perform suboccipital release. Also trialed use of Chirp Wheel XR to address trigger points in cervical paraspinals. Pt with better relief with use of this device, provided handout for where to purchase device online.  ***   PATIENT EDUCATION:  Education details: continue HEP and added to HEP, Chirp Wheel XR*** Person educated: Patient Education method: Programmer, multimedia, Facilities manager, and Handouts Education comprehension: verbalized understanding, returned demonstration, and needs further education  HOME EXERCISE PROGRAM: Access Code: [email or web address] URL: [email or web address] Date: 11/21/2023 Prepared by: Vernon Prey April Kirstie Peri  Exercises - Seated Upper Trapezius Stretch  - 1 x daily - 7 x weekly - 2 sets - 30 sec hold - Gentle Levator Scapulae Stretch (Mirrored)  - 1 x daily - 7 x weekly - 2 sets - 30 sec hold - Seated Assisted Cervical Rotation with Towel  - 1 x daily - 7 x weekly - 2 sets - 30 sec hold - Upper Cervical Extension SNAG with Strap  - 1 x daily - 7 x weekly - 2 sets - 30 sec hold - Standing Cervical Retraction  - 1 x daily - 7 x weekly - 2 sets - 10 reps - Supine Shoulder Flexion with Dowel  - 1 x daily - 7 x weekly - 3 sets - 10 reps - Standing Shoulder Abduction AAROM with Dowel  - 1 x daily - 7 x weekly - 3 sets - 10 reps   ASSESSMENT:  CLINICAL IMPRESSION: Emphasis of skilled PT session on *** Pt continues to benefit from skilled PT services to work towards improving his cervical and shoulder ROM in order to decrease his pain and improve his function. Continue POC.   OBJECTIVE IMPAIRMENTS: decreased activity tolerance, decreased balance, decreased coordination, decreased endurance, decreased mobility, decreased ROM, decreased strength, hypomobility, increased fascial restrictions, increased muscle spasms, impaired flexibility, impaired UE functional use, improper body  mechanics, postural  dysfunction, and pain.   ACTIVITY LIMITATIONS: carrying, lifting, sleeping, bathing, toileting, dressing, reach over head, hygiene/grooming, and caring for others  PARTICIPATION LIMITATIONS: meal prep, cleaning, laundry, driving, shopping, and community activity  PERSONAL FACTORS: Age, Fitness, Past/current experiences, and Time since onset of injury/illness/exacerbation are also affecting patient's functional outcome.   REHAB POTENTIAL: Good  CLINICAL DECISION MAKING: Evolving/moderate complexity  EVALUATION COMPLEXITY: Moderate   GOALS: Goals reviewed with patient? Yes  SHORT TERM GOALS: Target date: 12/13/2023***   Pt will be ind with initial HEP Baseline:  Goal status: INITIAL  2.  Pt will have improved cervical rotation by at least 10 deg R&L Baseline:  Goal status: INITIAL  3.  Pt will report 50% improvement in his overall pain Baseline:  Goal status: INITIAL    LONG TERM GOALS: Target date: 01/03/2024   Pt will be ind with management and progression of HEP Baseline:  Goal status: INITIAL  2.  Pt will demo at least 60 deg of cervical rotation R&L Baseline:  Goal status: INITIAL  3.  Pt will demo full shoulder ROM without pain/pulling into his neck for overhead lifting Baseline:  Goal status: INITIAL  4.  Pt will report >/=75% improvement in overall pain Baseline:  Goal status: INITIAL  5.  Pt will have improved NDI to </=30% to demo MCID Baseline:  Goal status: INITIAL   PLAN:  PT FREQUENCY: 1-2x/week  PT DURATION: 6 weeks  PLANNED INTERVENTIONS: 97164- PT Re-evaluation, 97110-Therapeutic exercises, 97530- Therapeutic activity, 97112- Neuromuscular re-education, 97535- Self Care, 16109- Manual therapy, 97014- Electrical stimulation (unattended), 97033- Ionotophoresis 4mg /ml Dexamethasone, Patient/Family education, Taping, Dry Needling, Joint mobilization, Spinal mobilization, Cryotherapy, and Moist heat  PLAN FOR NEXT SESSION: STG assess, Assess  response to HEP. Manual work for multiple trigger points and joint hypomobility. Work on decreasing forward head as it is placing pressure on upper cervical. E-stim as needed. NO TPDN,  shoulder stabilization and strengthening (try prone or seated), SCM stretch?, cervical retract + rotate***   Peter Congo, PT Peter Congo, PT, DPT, CSRS  12/13/2023, 7:51 AM

## 2023-12-17 ENCOUNTER — Ambulatory Visit: Payer: Medicaid Other | Admitting: Physical Therapy

## 2023-12-17 DIAGNOSIS — M6281 Muscle weakness (generalized): Secondary | ICD-10-CM

## 2023-12-17 DIAGNOSIS — M542 Cervicalgia: Secondary | ICD-10-CM

## 2023-12-17 DIAGNOSIS — R293 Abnormal posture: Secondary | ICD-10-CM

## 2023-12-17 DIAGNOSIS — R252 Cramp and spasm: Secondary | ICD-10-CM

## 2023-12-17 NOTE — Therapy (Signed)
 OUTPATIENT PHYSICAL THERAPY CERVICAL TREATMENT   Patient Name: Franklin Love MRN: [id number] DOB:10/28/1980, 43 y.o., male Today's Date: 12/17/2023  END OF SESSION:  PT End of Session - 12/17/23 1458     Visit Number 3    Number of Visits 12    Date for PT Re-Evaluation 01/03/24    Authorization Type Medicaid    PT Start Time 1458   late arrival   PT Stop Time 1530    PT Time Calculation (min) 32 min    Activity Tolerance Patient tolerated treatment well    Behavior During Therapy [facility] for tasks assessed/performed            No past medical history on file. No past surgical history on file. Patient Active Problem List   Diagnosis Date Noted   Cellulitis of right foot 11/28/2021   Neck pain 06/23/2020   Acute bilateral low back pain without sciatica 06/23/2020    PCP: Pcp, No  REFERRING PROVIDER: Remus Loffler, PA-C  REFERRING DIAG: M43.6 (ICD-10-CM) - Torticollis  THERAPY DIAG:  No diagnosis found.  Rationale for Evaluation and Treatment: Rehabilitation  ONSET DATE: ~1 month   SUBJECTIVE:                                                                                                                                                                                                         SUBJECTIVE STATEMENT: Pt states he thinks his neck is getting better. Pt reports life has been hectic so has not been able to do his exercise as much.   Hand dominance: Right  PERTINENT HISTORY:  Sciatica  PAIN:  Are you having pain? Yes: NPRS scale: 4.5 when he moves his head, 0 at rest  Pain location: R posterior neck Pain description: dull ache Aggravating factors: Lifting, moving head Relieving factors: Heat  PRECAUTIONS: None  RED FLAGS: None    WEIGHT BEARING RESTRICTIONS: No  FALLS:  Has patient fallen in last 6 months? No  LIVING ENVIRONMENT: Lives with: lives with their family Lives in: House/apartment  OCCUPATION: Not working currently  PLOF:  Independent  PATIENT GOALS: Improve neck motion and pain  NEXT MD VISIT: n/a  OBJECTIVE:  Note: Objective measures were completed at Evaluation unless otherwise noted.  DIAGNOSTIC FINDINGS:  FINDINGS: There is no evidence of cervical spine fracture or prevertebral soft tissue swelling. Minimal kyphosis of cervical spine. Minimal anterior spurring noted at C5-6. No other significant bone abnormalities are identified.  PATIENT SURVEYS:  Neck Disability Index score: 20 / 50 = 40.0 %  COGNITION:  Overall cognitive status: Within functional limits for tasks assessed  SENSATION: WFL  POSTURE: forward head  PALPATION: TTP R>L levator scap, UT, upper cervical paraspinals Hypomobile C1-C3 with PA mobs   CERVICAL ROM:   Active ROM A/PROM (deg) eval  Flexion 30  Extension 45  Right lateral flexion 15 pain  Left lateral flexion 28 with pull  Right rotation 28 pain  Left rotation 38   (Blank rows = not tested)  UPPER EXTREMITY ROM:  Active ROM Right eval Left eval  Shoulder flexion 90 pulls into neck   Shoulder extension    Shoulder abduction 100 without compensation   Shoulder adduction    Shoulder extension    Shoulder internal rotation To lower sacrum   Shoulder external rotation C7 with compensatory movement (has to use L UE to assist)   Elbow flexion    Elbow extension    Wrist flexion    Wrist extension    Wrist ulnar deviation    Wrist radial deviation    Wrist pronation    Wrist supination     (Blank rows = not tested)  UPPER EXTREMITY MMT: TBA  MMT Right eval Left eval  Shoulder flexion    Shoulder extension    Shoulder abduction    Shoulder adduction    Shoulder extension    Shoulder internal rotation    Shoulder external rotation    Middle trapezius    Lower trapezius    Elbow flexion    Elbow extension    Wrist flexion    Wrist extension    Wrist ulnar deviation    Wrist radial deviation    Wrist pronation    Wrist supination     Grip strength     (Blank rows = not tested)  CERVICAL SPECIAL TESTS:  Did not assess  TREATMENT: Therex Pulleys flexion x1 min, abd x1 min, horizontal abd x1 min Thoracic extension against ball with hands behind head x10 Thoracic ext + rotation against ball with hands behind head x10 Self massage for improved muscle length/extensibility using theracane  Manual Therapy STM & TPR R cervical paraspinal/splenius capitus Cervical MWM rotation and retraction to maintain neutral neck Cervical grade II to III lateral glides x10, grade II to III cervical rotation glides x10   Neuro Re-ed (with mirror in front of patient to maintain neutral neck) Cervical retraction seated 2x10 Cervical rotation iso 3x5" R&L to inhibit R cervical paraspinal Seated Shoulder flexion 2x10 focusing on improving scapular movement/low traps and decreasing UT activation Seated Shoulder abd 2x10 focusing on scapular movement and decreasing UT activation Seated scap squeeze x 10     PATIENT EDUCATION:  Education details: continue HEP/progressions, theracane Person educated: Patient Education method: Explanation, Demonstration, and Handouts Education comprehension: verbalized understanding, returned demonstration, and needs further education  HOME EXERCISE PROGRAM: Access Code: [email or web address] URL: [email or web address] Date: 12/17/2023 Prepared by: Vernon Prey April Kirstie Peri  Exercises - Seated Upper Trapezius Stretch  - 1 x daily - 7 x weekly - 2 sets - 30 sec hold - Gentle Levator Scapulae Stretch (Mirrored)  - 1 x daily - 7 x weekly - 2 sets - 30 sec hold - Seated Assisted Cervical Rotation with Towel  - 1 x daily - 7 x weekly - 2 sets - 30 sec hold - Upper Cervical Extension SNAG with Strap  - 1 x daily - 7 x weekly - 2 sets - 30 sec hold - Standing Cervical Retraction  - 1 x daily - 7 x weekly -  2 sets - 10 reps - Standing Shoulder Abduction AAROM with Dowel  - 1 x daily - 7 x weekly - 3 sets  - 10 reps - Seated Thoracic Lumbar Extension with Pectoralis Stretch  - 1 x daily - 7 x weekly - 2 sets - 10 reps - Low Trap Setting at Wall  - 1 x daily - 7 x weekly - 2 sets - 10 reps   ASSESSMENT:  CLINICAL IMPRESSION: Treatment focused on reducing pt's neck muscle tightness, cervical hypomobility along R upper cervical spine and midthoracic hypomobility. Provided manual therapy to address and performed inhibition exercises. Primarily worked on improving neck posture awareness -- tends to keep his head in R lateral flexion. Worked on maintaining neutral neck and performing cervical retraction correctly without lateral flexion. Improved shoulder range after increasing thoracic extension and focus on scapular mobility.    OBJECTIVE IMPAIRMENTS: decreased activity tolerance, decreased balance, decreased coordination, decreased endurance, decreased mobility, decreased ROM, decreased strength, hypomobility, increased fascial restrictions, increased muscle spasms, impaired flexibility, impaired UE functional use, improper body mechanics, postural dysfunction, and pain.     GOALS: Goals reviewed with patient? Yes  SHORT TERM GOALS: Target date: 12/13/2023   Pt will be ind with initial HEP Baseline:  Goal status: IN PROGRESS  2.  Pt will have improved cervical rotation by at least 10 deg R&L Baseline:  Goal status: INITIAL  3.  Pt will report 50% improvement in his overall pain Baseline:  Goal status: MET    LONG TERM GOALS: Target date: 01/03/2024   Pt will be ind with management and progression of HEP Baseline:  Goal status: INITIAL  2.  Pt will demo at least 60 deg of cervical rotation R&L Baseline:  Goal status: INITIAL  3.  Pt will demo full shoulder ROM without pain/pulling into his neck for overhead lifting Baseline:  Goal status: INITIAL  4.  Pt will report >/=75% improvement in overall pain Baseline:  Goal status: INITIAL  5.  Pt will have improved NDI to  </=30% to demo MCID Baseline:  Goal status: INITIAL   PLAN:  PT FREQUENCY: 1-2x/week  PT DURATION: 6 weeks  PLANNED INTERVENTIONS: 97164- PT Re-evaluation, 97110-Therapeutic exercises, 97530- Therapeutic activity, 97112- Neuromuscular re-education, 97535- Self Care, 60454- Manual therapy, 97014- Electrical stimulation (unattended), 97033- Ionotophoresis 4mg /ml Dexamethasone, Patient/Family education, Taping, Dry Needling, Joint mobilization, Spinal mobilization, Cryotherapy, and Moist heat  PLAN FOR NEXT SESSION: STG assess, Assess response to HEP. Manual work for multiple trigger points and joint hypomobility. Work on neutral neck positioning, thoracic and cervical mobility, E-stim as needed. NO TPDN,  shoulder stabilization and strengthening (try prone or seated), SCM stretch?, cervical retract + rotate   Bladimir Auman April Ma L Trevar Boehringer, PT 12/17/2023, 2:58 PM

## 2023-12-20 ENCOUNTER — Ambulatory Visit: Payer: Medicaid Other | Admitting: Physical Therapy

## 2023-12-24 ENCOUNTER — Ambulatory Visit: Payer: Medicaid Other | Attending: Physician Assistant | Admitting: Physical Therapy

## 2023-12-24 NOTE — Therapy (Deleted)
 OUTPATIENT PHYSICAL THERAPY CERVICAL TREATMENT   Patient Name: Franklin Love MRN: [id number] DOB:12/23/1980, 43 y.o., male Today's Date: 12/24/2023  END OF SESSION:   No past medical history on file. No past surgical history on file. Patient Active Problem List   Diagnosis Date Noted   Cellulitis of right foot 11/28/2021   Neck pain 06/23/2020   Acute bilateral low back pain without sciatica 06/23/2020    PCP: Pcp, No  REFERRING PROVIDER: Remus Loffler, PA-C  REFERRING DIAG: M43.6 (ICD-10-CM) - Torticollis  THERAPY DIAG:  No diagnosis found.  Rationale for Evaluation and Treatment: Rehabilitation  ONSET DATE: ~1 month   SUBJECTIVE:                                                                                                                                                                                                         SUBJECTIVE STATEMENT: ***  Hand dominance: Right  PERTINENT HISTORY:  Sciatica  PAIN:  Are you having pain? Yes: NPRS scale: 4.5 when he moves his head, 0 at rest  Pain location: R posterior neck Pain description: dull ache Aggravating factors: Lifting, moving head Relieving factors: Heat  PRECAUTIONS: None  RED FLAGS: None    WEIGHT BEARING RESTRICTIONS: No  FALLS:  Has patient fallen in last 6 months? No  LIVING ENVIRONMENT: Lives with: lives with their family Lives in: House/apartment  OCCUPATION: Not working currently  PLOF: Independent  PATIENT GOALS: Improve neck motion and pain  NEXT MD VISIT: n/a  OBJECTIVE:  Note: Objective measures were completed at Evaluation unless otherwise noted.  DIAGNOSTIC FINDINGS:  FINDINGS: There is no evidence of cervical spine fracture or prevertebral soft tissue swelling. Minimal kyphosis of cervical spine. Minimal anterior spurring noted at C5-6. No other significant bone abnormalities are identified.  PATIENT SURVEYS:  Neck Disability Index score: 20 / 50 = 40.0  %  COGNITION: Overall cognitive status: Within functional limits for tasks assessed  SENSATION: WFL  POSTURE: forward head  PALPATION: TTP R>L levator scap, UT, upper cervical paraspinals Hypomobile C1-C3 with PA mobs   CERVICAL ROM:   Active ROM A/PROM (deg) eval  Flexion 30  Extension 45  Right lateral flexion 15 pain  Left lateral flexion 28 with pull  Right rotation 28 pain  Left rotation 38   (Blank rows = not tested)  UPPER EXTREMITY ROM:  Active ROM Right eval Left eval  Shoulder flexion 90 pulls into neck   Shoulder extension    Shoulder abduction 100 without compensation   Shoulder adduction    Shoulder extension  Shoulder internal rotation To lower sacrum   Shoulder external rotation C7 with compensatory movement (has to use L UE to assist)   Elbow flexion    Elbow extension    Wrist flexion    Wrist extension    Wrist ulnar deviation    Wrist radial deviation    Wrist pronation    Wrist supination     (Blank rows = not tested)  UPPER EXTREMITY MMT: TBA  MMT Right eval Left eval  Shoulder flexion    Shoulder extension    Shoulder abduction    Shoulder adduction    Shoulder extension    Shoulder internal rotation    Shoulder external rotation    Middle trapezius    Lower trapezius    Elbow flexion    Elbow extension    Wrist flexion    Wrist extension    Wrist ulnar deviation    Wrist radial deviation    Wrist pronation    Wrist supination    Grip strength     (Blank rows = not tested)  CERVICAL SPECIAL TESTS:  Did not assess  TREATMENT: Therex Pulleys flexion x1 min, abd x1 min, horizontal abd x1 min Thoracic extension against ball with hands behind head x10 Thoracic ext + rotation against ball with hands behind head x10 Self massage for improved muscle length/extensibility using theracane  Manual Therapy ***  Neuro Re-ed (with mirror in front of patient to maintain neutral neck) Cervical retraction seated  2x10 Cervical rotation iso 3x5" R&L to inhibit R cervical paraspinal Seated Shoulder flexion 2x10 focusing on improving scapular movement/low traps and decreasing UT activation Seated Shoulder abd 2x10 focusing on scapular movement and decreasing UT activation Seated scap squeeze x 10     PATIENT EDUCATION:  Education details: continue HEP/progressions, theracane*** Person educated: Patient Education method: Explanation, Demonstration, and Handouts Education comprehension: verbalized understanding, returned demonstration, and needs further education  HOME EXERCISE PROGRAM: Access Code: [email or web address] URL: [email or web address] Date: 12/17/2023 Prepared by: Vernon Prey April Kirstie Peri  Exercises - Seated Upper Trapezius Stretch  - 1 x daily - 7 x weekly - 2 sets - 30 sec hold - Gentle Levator Scapulae Stretch (Mirrored)  - 1 x daily - 7 x weekly - 2 sets - 30 sec hold - Seated Assisted Cervical Rotation with Towel  - 1 x daily - 7 x weekly - 2 sets - 30 sec hold - Upper Cervical Extension SNAG with Strap  - 1 x daily - 7 x weekly - 2 sets - 30 sec hold - Standing Cervical Retraction  - 1 x daily - 7 x weekly - 2 sets - 10 reps - Standing Shoulder Abduction AAROM with Dowel  - 1 x daily - 7 x weekly - 3 sets - 10 reps - Seated Thoracic Lumbar Extension with Pectoralis Stretch  - 1 x daily - 7 x weekly - 2 sets - 10 reps - Low Trap Setting at Wall  - 1 x daily - 7 x weekly - 2 sets - 10 reps   ASSESSMENT:  CLINICAL IMPRESSION: Emphasis of skilled PT session*** Continue POC.    OBJECTIVE IMPAIRMENTS: decreased activity tolerance, decreased balance, decreased coordination, decreased endurance, decreased mobility, decreased ROM, decreased strength, hypomobility, increased fascial restrictions, increased muscle spasms, impaired flexibility, impaired UE functional use, improper body mechanics, postural dysfunction, and pain.     GOALS: Goals reviewed with patient?  Yes  SHORT TERM GOALS: Target date: 12/13/2023   Pt will be ind with initial  HEP Baseline:  Goal status: IN PROGRESS  2.  Pt will have improved cervical rotation by at least 10 deg R&L Baseline:  Goal status: INITIAL  3.  Pt will report 50% improvement in his overall pain Baseline:  Goal status: MET    LONG TERM GOALS: Target date: 01/03/2024   Pt will be ind with management and progression of HEP Baseline:  Goal status: INITIAL  2.  Pt will demo at least 60 deg of cervical rotation R&L Baseline:  Goal status: INITIAL  3.  Pt will demo full shoulder ROM without pain/pulling into his neck for overhead lifting Baseline:  Goal status: INITIAL  4.  Pt will report >/=75% improvement in overall pain Baseline:  Goal status: INITIAL  5.  Pt will have improved NDI to </=30% to demo MCID Baseline:  Goal status: INITIAL   PLAN:  PT FREQUENCY: 1-2x/week  PT DURATION: 6 weeks  PLANNED INTERVENTIONS: 97164- PT Re-evaluation, 97110-Therapeutic exercises, 97530- Therapeutic activity, 97112- Neuromuscular re-education, 97535- Self Care, 09811- Manual therapy, 97014- Electrical stimulation (unattended), 97033- Ionotophoresis 4mg /ml Dexamethasone, Patient/Family education, Taping, Dry Needling, Joint mobilization, Spinal mobilization, Cryotherapy, and Moist heat  PLAN FOR NEXT SESSION: STG assess, Assess response to HEP. Manual work for multiple trigger points and joint hypomobility. Work on neutral neck positioning, thoracic and cervical mobility, E-stim as needed. NO TPDN,  shoulder stabilization and strengthening (try prone or seated), SCM stretch?, cervical retract + rotate***   Maygen Sirico April Ma L Kimbree Casanas, PT 12/24/2023, 9:01 AM

## 2023-12-27 ENCOUNTER — Ambulatory Visit: Payer: Medicaid Other | Admitting: Physical Therapy

## 2024-01-02 ENCOUNTER — Telehealth: Payer: Self-pay | Admitting: Physical Therapy

## 2024-01-02 ENCOUNTER — Encounter: Payer: Self-pay | Admitting: Physical Therapy

## 2024-01-02 ENCOUNTER — Ambulatory Visit: Payer: Self-pay | Admitting: Physical Therapy

## 2024-01-02 NOTE — Therapy (Signed)
 [facility] [address] [place], [place], [address] Phone: [phone]   Fax:  [phone]  Patient Details  Name: Franklin Love MRN: [id number] Date of Birth: 26-Jul-1981 Referring Provider:  No ref. provider found  Encounter Date: 01/02/2024  Patient to be d/c from OPPT services at this time. Pt has exceeded No Call/No Show policy for clinic with 4 missed appointments. Attempted to call patient to update him but his VM was full. Did speak with patient on this phone before this appointment after his last missed appointment and informed him of policy. Patient will be d/c from PT at this time and will need a new referral to return to PT in the future.  Peter Congo, PT Peter Congo, PT, DPT, CSRS  01/02/2024, 3:05 PM  [facility] [address] [place], [place], [address] Phone: [phone]   Fax:  [phone]
